# Patient Record
Sex: Female | Born: 1950 | Hispanic: Yes | Marital: Married | Smoking: Never smoker
Health system: Southern US, Community
[De-identification: ages and names within clinical notes are randomized; demographics above are authoritative.]

## PROBLEM LIST (undated history)

## (undated) DIAGNOSIS — R112 Nausea with vomiting, unspecified: Secondary | ICD-10-CM

## (undated) DIAGNOSIS — I10 Essential (primary) hypertension: Secondary | ICD-10-CM

## (undated) DIAGNOSIS — G5620 Lesion of ulnar nerve, unspecified upper limb: Secondary | ICD-10-CM

## (undated) DIAGNOSIS — Z9889 Other specified postprocedural states: Secondary | ICD-10-CM

---

## 2012-09-18 HISTORY — PX: OTHER SURGICAL HISTORY: SHX169

## 2015-01-26 ENCOUNTER — Encounter (HOSPITAL_BASED_OUTPATIENT_CLINIC_OR_DEPARTMENT_OTHER): Payer: Self-pay | Admitting: *Deleted

## 2015-01-26 NOTE — Progress Notes (Signed)
SPOKE W/ PT'S DAUGHTER, NAYELI RAMIREZ, WHOM WILL BE SPANISH INTERPRETOR DOS.  NPO AFTER MN WITH CLEAR LIQUIDS UNTIL 0700 (NO CREAM/ MILK PRODUCTS).  ARRIVE AT 1130. NEEDS ISTAT AND EKG.

## 2015-01-28 ENCOUNTER — Encounter (HOSPITAL_BASED_OUTPATIENT_CLINIC_OR_DEPARTMENT_OTHER): Payer: Self-pay | Admitting: *Deleted

## 2015-01-28 ENCOUNTER — Encounter (HOSPITAL_BASED_OUTPATIENT_CLINIC_OR_DEPARTMENT_OTHER)
Admission: RE | Disposition: A | Discharge status: Home / Self Care | Payer: Self-pay | Source: Ambulatory Visit | Attending: Orthopedic Surgery

## 2015-01-28 ENCOUNTER — Ambulatory Visit (HOSPITAL_BASED_OUTPATIENT_CLINIC_OR_DEPARTMENT_OTHER): Payer: 59 | Admitting: Anesthesiology

## 2015-01-28 ENCOUNTER — Ambulatory Visit (HOSPITAL_BASED_OUTPATIENT_CLINIC_OR_DEPARTMENT_OTHER)
Admission: RE | Admit: 2015-01-28 | Discharge: 2015-01-28 | Disposition: A | Discharge status: Home / Self Care | Payer: 59 | Source: Ambulatory Visit | Attending: Orthopedic Surgery | Admitting: Orthopedic Surgery

## 2015-01-28 DIAGNOSIS — M79601 Pain in right arm: Secondary | ICD-10-CM | POA: Diagnosis present

## 2015-01-28 DIAGNOSIS — Z9114 Patient's other noncompliance with medication regimen: Secondary | ICD-10-CM | POA: Insufficient documentation

## 2015-01-28 DIAGNOSIS — G5621 Lesion of ulnar nerve, right upper limb: Secondary | ICD-10-CM | POA: Insufficient documentation

## 2015-01-28 DIAGNOSIS — T8484XA Pain due to internal orthopedic prosthetic devices, implants and grafts, initial encounter: Secondary | ICD-10-CM | POA: Insufficient documentation

## 2015-01-28 DIAGNOSIS — I10 Essential (primary) hypertension: Secondary | ICD-10-CM | POA: Diagnosis not present

## 2015-01-28 HISTORY — DX: Essential (primary) hypertension: I10

## 2015-01-28 HISTORY — DX: Nausea with vomiting, unspecified: R11.2

## 2015-01-28 HISTORY — PX: ULNAR NERVE TRANSPOSITION: SHX2595

## 2015-01-28 HISTORY — PX: HARDWARE REMOVAL: SHX979

## 2015-01-28 HISTORY — DX: Lesion of ulnar nerve, unspecified upper limb: G56.20

## 2015-01-28 HISTORY — DX: Other specified postprocedural states: Z98.890

## 2015-01-28 LAB — POCT I-STAT 4, (NA,K, GLUC, HGB,HCT)
GLUCOSE: 104 mg/dL — AB (ref 65–99)
HEMATOCRIT: 42 % (ref 36.0–46.0)
Hemoglobin: 14.3 g/dL (ref 12.0–15.0)
POTASSIUM: 4 mmol/L (ref 3.5–5.1)
SODIUM: 141 mmol/L (ref 135–145)

## 2015-01-28 SURGERY — ULNAR NERVE DECOMPRESSION/TRANSPOSITION
Anesthesia: General | Site: Elbow | Laterality: Right

## 2015-01-28 MED ORDER — LACTATED RINGERS IV SOLN
INTRAVENOUS | Status: DC
  Administered 2015-01-28 (×2): via INTRAVENOUS
  Filled 2015-01-28: qty 1000

## 2015-01-28 MED ORDER — PROMETHAZINE HCL 25 MG/ML IJ SOLN
6.2500 mg | INTRAMUSCULAR | Status: DC | PRN
  Filled 2015-01-28: qty 1

## 2015-01-28 MED ORDER — MIDAZOLAM HCL 5 MG/5ML IJ SOLN
INTRAMUSCULAR | Status: DC | PRN
  Administered 2015-01-28: 2 mg via INTRAVENOUS

## 2015-01-28 MED ORDER — FENTANYL CITRATE (PF) 100 MCG/2ML IJ SOLN
INTRAMUSCULAR | Status: AC
  Filled 2015-01-28: qty 4

## 2015-01-28 MED ORDER — METOCLOPRAMIDE HCL 5 MG/ML IJ SOLN
INTRAMUSCULAR | Status: DC | PRN
  Administered 2015-01-28: 10 mg via INTRAVENOUS

## 2015-01-28 MED ORDER — FENTANYL CITRATE (PF) 100 MCG/2ML IJ SOLN
INTRAMUSCULAR | Status: DC | PRN
  Administered 2015-01-28 (×4): 50 ug via INTRAVENOUS

## 2015-01-28 MED ORDER — PROPOFOL 10 MG/ML IV BOLUS
INTRAVENOUS | Status: DC | PRN
  Administered 2015-01-28: 150 mg via INTRAVENOUS
  Administered 2015-01-28: 20 mg via INTRAVENOUS
  Administered 2015-01-28: 50 mg via INTRAVENOUS
  Administered 2015-01-28: 20 mg via INTRAVENOUS

## 2015-01-28 MED ORDER — ONDANSETRON HCL 4 MG/2ML IJ SOLN
INTRAMUSCULAR | Status: DC | PRN
  Administered 2015-01-28: 4 mg via INTRAVENOUS

## 2015-01-28 MED ORDER — CHLORHEXIDINE GLUCONATE 4 % EX LIQD
60.0000 mL | Freq: Once | CUTANEOUS | Status: DC
  Filled 2015-01-28: qty 60

## 2015-01-28 MED ORDER — MEPERIDINE HCL 25 MG/ML IJ SOLN
6.2500 mg | INTRAMUSCULAR | Status: DC | PRN
  Filled 2015-01-28: qty 1

## 2015-01-28 MED ORDER — OXYCODONE-ACETAMINOPHEN 5-325 MG PO TABS: 1.0000 | ORAL_TABLET | ORAL | Status: DC | PRN

## 2015-01-28 MED ORDER — ACETAMINOPHEN 10 MG/ML IV SOLN
INTRAVENOUS | Status: DC | PRN
Start: 2015-01-28 — End: 2015-01-28
  Administered 2015-01-28: 1000 mg via INTRAVENOUS

## 2015-01-28 MED ORDER — OXYCODONE HCL 5 MG PO TABS
5.0000 mg | ORAL_TABLET | Freq: Once | ORAL | Status: AC
Start: 2015-01-28 — End: 2015-01-28
  Administered 2015-01-28: 5 mg via ORAL
  Filled 2015-01-28: qty 1

## 2015-01-28 MED ORDER — BUPIVACAINE HCL (PF) 0.25 % IJ SOLN
INTRAMUSCULAR | Status: DC | PRN
  Administered 2015-01-28: 10 mL

## 2015-01-28 MED ORDER — CEFAZOLIN SODIUM-DEXTROSE 2-3 GM-% IV SOLR
2.0000 g | INTRAVENOUS | Status: AC
  Administered 2015-01-28: 2 g via INTRAVENOUS
  Filled 2015-01-28: qty 50

## 2015-01-28 MED ORDER — CEFAZOLIN SODIUM-DEXTROSE 2-3 GM-% IV SOLR
INTRAVENOUS | Status: AC
  Filled 2015-01-28: qty 50

## 2015-01-28 MED ORDER — FENTANYL CITRATE (PF) 100 MCG/2ML IJ SOLN
INTRAMUSCULAR | Status: AC
  Filled 2015-01-28: qty 2

## 2015-01-28 MED ORDER — LIDOCAINE HCL (CARDIAC) 20 MG/ML IV SOLN
INTRAVENOUS | Status: DC | PRN
  Administered 2015-01-28: 60 mg via INTRAVENOUS
  Administered 2015-01-28: 40 mg via INTRAVENOUS

## 2015-01-28 MED ORDER — DEXAMETHASONE SODIUM PHOSPHATE 4 MG/ML IJ SOLN
INTRAMUSCULAR | Status: DC | PRN
  Administered 2015-01-28: 10 mg via INTRAVENOUS

## 2015-01-28 MED ORDER — SUCCINYLCHOLINE CHLORIDE 20 MG/ML IJ SOLN
INTRAMUSCULAR | Status: DC | PRN
Start: 2015-01-28 — End: 2015-01-28
  Administered 2015-01-28: 100 mg via INTRAVENOUS

## 2015-01-28 MED ORDER — OXYCODONE HCL 5 MG PO TABS
ORAL_TABLET | ORAL | Status: AC
  Filled 2015-01-28: qty 1

## 2015-01-28 MED ORDER — DOCUSATE SODIUM 100 MG PO CAPS: 100.0000 mg | ORAL_CAPSULE | Freq: Two times a day (BID) | ORAL | Status: DC

## 2015-01-28 MED ORDER — MIDAZOLAM HCL 2 MG/2ML IJ SOLN
INTRAMUSCULAR | Status: AC
  Filled 2015-01-28: qty 2

## 2015-01-28 MED ORDER — FENTANYL CITRATE (PF) 100 MCG/2ML IJ SOLN
25.0000 ug | INTRAMUSCULAR | Status: DC | PRN
  Administered 2015-01-28: 25 ug via INTRAVENOUS
  Administered 2015-01-28: 50 ug via INTRAVENOUS
  Administered 2015-01-28: 25 ug via INTRAVENOUS
  Filled 2015-01-28: qty 1

## 2015-01-28 MED ORDER — LACTATED RINGERS IV SOLN
INTRAVENOUS | Status: DC
  Filled 2015-01-28: qty 1000

## 2015-01-28 MED ORDER — SODIUM CHLORIDE 0.9 % IR SOLN
Status: DC | PRN
  Administered 2015-01-28: 500 mL

## 2015-01-28 SURGICAL SUPPLY — 49 items
BANDAGE ELASTIC 4 VELCRO ST LF (GAUZE/BANDAGES/DRESSINGS) ×6 IMPLANT
BLADE SURG 15 STRL LF DISP TIS (BLADE) ×2 IMPLANT
BLADE SURG 15 STRL SS (BLADE) ×1
BNDG ESMARK 4X9 LF (GAUZE/BANDAGES/DRESSINGS) ×3 IMPLANT
BNDG GAUZE ELAST 4 BULKY (GAUZE/BANDAGES/DRESSINGS) ×6 IMPLANT
CORDS BIPOLAR (ELECTRODE) ×3 IMPLANT
COVER BACK TABLE 60X90IN (DRAPES) ×3 IMPLANT
COVER MAYO STAND STRL (DRAPES) ×3 IMPLANT
CUFF TOURNIQUET SINGLE 18IN (TOURNIQUET CUFF) ×3 IMPLANT
DRAIN PENROSE 18X1/4 LTX STRL (WOUND CARE) IMPLANT
DRAPE EXTREMITY T 121X128X90 (DRAPE) ×3 IMPLANT
DRAPE LG THREE QUARTER DISP (DRAPES) ×6 IMPLANT
DRAPE SURG 17X23 STRL (DRAPES) ×3 IMPLANT
DRAPE U-SHAPE 47X51 STRL (DRAPES) ×6 IMPLANT
DRSG ADAPTIC 3X8 NADH LF (GAUZE/BANDAGES/DRESSINGS) ×6 IMPLANT
ELECT REM PT RETURN 9FT ADLT (ELECTROSURGICAL) ×3
ELECTRODE REM PT RTRN 9FT ADLT (ELECTROSURGICAL) ×2 IMPLANT
GLOVE BIO SURGEON STRL SZ 6.5 (GLOVE) ×3 IMPLANT
GLOVE BIOGEL PI IND STRL 6.5 (GLOVE) ×2 IMPLANT
GLOVE BIOGEL PI IND STRL 8.5 (GLOVE) ×2 IMPLANT
GLOVE BIOGEL PI INDICATOR 6.5 (GLOVE) ×1
GLOVE BIOGEL PI INDICATOR 8.5 (GLOVE) ×1
GLOVE SURG ORTHO 8.0 STRL STRW (GLOVE) ×3 IMPLANT
GOWN STRL REUS W/ TWL LRG LVL3 (GOWN DISPOSABLE) ×4 IMPLANT
GOWN STRL REUS W/TWL LRG LVL3 (GOWN DISPOSABLE) ×2
NEEDLE HYPO 25X1 1.5 SAFETY (NEEDLE) ×3 IMPLANT
NS IRRIG 500ML POUR BTL (IV SOLUTION) ×3 IMPLANT
PACK BASIN DAY SURGERY FS (CUSTOM PROCEDURE TRAY) ×3 IMPLANT
PAD CAST 4YDX4 CTTN HI CHSV (CAST SUPPLIES) ×4 IMPLANT
PADDING CAST COTTON 4X4 STRL (CAST SUPPLIES) ×2
PENCIL BUTTON HOLSTER BLD 10FT (ELECTRODE) ×3 IMPLANT
SLING ARM XL FOAM STRAP (SOFTGOODS) ×3 IMPLANT
SPLINT FIBERGLASS 4X30 (CAST SUPPLIES) ×3 IMPLANT
SPONGE GAUZE 4X4 12PLY (GAUZE/BANDAGES/DRESSINGS) ×3 IMPLANT
SPONGE GAUZE 4X4 12PLY STER LF (GAUZE/BANDAGES/DRESSINGS) ×6 IMPLANT
SPONGE LAP 4X18 X RAY DECT (DISPOSABLE) ×3 IMPLANT
STOCKINETTE 4X48 STRL (DRAPES) ×3 IMPLANT
SUCTION FRAZIER TIP 10 FR DISP (SUCTIONS) ×3 IMPLANT
SUT PROLENE 3 0 PS 2 (SUTURE) ×9 IMPLANT
SUT VIC AB 0 CT1 36 (SUTURE) ×3 IMPLANT
SUT VIC AB 2-0 SH 27 (SUTURE) ×2
SUT VIC AB 2-0 SH 27XBRD (SUTURE) ×4 IMPLANT
SUT VICRYL 4-0 PS2 18IN ABS (SUTURE) ×6 IMPLANT
SYR BULB 3OZ (MISCELLANEOUS) ×3 IMPLANT
SYR CONTROL 10ML LL (SYRINGE) ×3 IMPLANT
TOWEL OR 17X24 6PK STRL BLUE (TOWEL DISPOSABLE) ×6 IMPLANT
TUBE CONNECTING 12X1/4 (SUCTIONS) ×3 IMPLANT
UNDERPAD 30X30 INCONTINENT (UNDERPADS AND DIAPERS) ×3 IMPLANT
YANKAUER SUCT BULB TIP NO VENT (SUCTIONS) ×3 IMPLANT

## 2015-01-28 NOTE — Anesthesia Preprocedure Evaluation (Addendum)
Anesthesia Evaluation  Patient identified by MRN, date of birth, ID band Patient awake    Reviewed: Allergy & Precautions, NPO status , Patient's Chart, lab work & pertinent test results  History of Anesthesia Complications (+) PONV  Airway Mallampati: II  TM Distance: >3 FB Neck ROM: Full    Dental no notable dental hx.    Pulmonary neg pulmonary ROS,  breath sounds clear to auscultation  Pulmonary exam normal       Cardiovascular hypertension, negative cardio ROS Normal cardiovascular examRhythm:Regular Rate:Normal     Neuro/Psych negative neurological ROS  negative psych ROS   GI/Hepatic negative GI ROS, Neg liver ROS,   Endo/Other  negative endocrine ROS  Renal/GU negative Renal ROS  negative genitourinary   Musculoskeletal negative musculoskeletal ROS (+)   Abdominal   Peds negative pediatric ROS (+)  Hematology negative hematology ROS (+)   Anesthesia Other Findings   Reproductive/Obstetrics negative OB ROS                            Anesthesia Physical Anesthesia Plan  ASA: II  Anesthesia Plan: General   Post-op Pain Management:    Induction: Intravenous  Airway Management Planned: Oral ETT  Additional Equipment:   Intra-op Plan:   Post-operative Plan: Extubation in OR  Informed Consent: I have reviewed the patients History and Physical, chart, labs and discussed the procedure including the risks, benefits and alternatives for the proposed anesthesia with the patient or authorized representative who has indicated his/her understanding and acceptance.   Dental advisory given  Plan Discussed with: CRNA  Anesthesia Plan Comments:         Anesthesia Quick Evaluation

## 2015-01-28 NOTE — Discharge Instructions (Signed)
KEEP BANDAGE CLEAN AND DRY CALL OFFICE FOR F/U APPT [phone] CALL FOR F/U APPT TO BE IN 14 DAYS KEEP HAND ELEVATED ABOVE HEART OK TO APPLY ICE TO OPERATIVE AREA CONTACT OFFICE IF ANY WORSENING PAIN OR CONCERNS.          HAND SURGERY    HOME CARE INSTRUCTIONS    The following instructions have been prepared to help you care for yourself upon your return home today.  Wound Care:  Keep your hand elevated above the level of your heart. Do not allow it to dangle by your side. Keep the dressing dry and do not remove it unless your doctor advises you to do so. He will usually change it at the time of you post-op visit. Moving your fingers is advised to stimulate circulation but will depend on the site of your surgery. Of course, if you have a splint applied your doctor will advise you about movement.  Activity:  Do not drive or operate machinery today. Rest today and then you may return to your normal activity and work as indicated by your physician.  Diet: Drink liquids today or eat a light diet. You may resume a regular diet tomorrow.  General expectations: Pain for two or three days. Fingers may become slightly swollen.   Unexpected Observations- Call your doctor if any of these occur: Severe pain not relieved by pain medication. Elevated temperature. Dressing soaked with blood. Inability to move fingers. White or bluish color to fingers.    Post Anesthesia Home Care Instructions  Activity: Get plenty of rest for the remainder of the day. A responsible adult should stay with you for 24 hours following the procedure.  For the next 24 hours, DO NOT: -Drive a car -Advertising copywriterperate machinery -Drink alcoholic beverages -Take any medication unless instructed by your physician -Make any legal decisions or sign important papers.  Meals: Start with liquid foods such as gelatin or soup. Progress to regular foods as tolerated. Avoid greasy, spicy, heavy foods. If nausea and/or vomiting  occur, drink only clear liquids until the nausea and/or vomiting subsides. Call your physician if vomiting continues.  Special Instructions/Symptoms: Your throat may feel dry or sore from the anesthesia or the breathing tube placed in your throat during surgery. If this causes discomfort, gargle with warm salt water. The discomfort should disappear within 24 hours.  If you had a scopolamine patch placed behind your ear for the management of post- operative nausea and/or vomiting:  1. The medication in the patch is effective for 72 hours, after which it should be removed.  Wrap patch in a tissue and discard in the trash. Wash hands thoroughly with soap and water. 2. You may remove the patch earlier than 72 hours if you experience unpleasant side effects which may include dry mouth, dizziness or visual disturbances. 3. Avoid touching the patch. Wash your hands with soap and water after contact with the patch.

## 2015-01-28 NOTE — H&P (Signed)
Mallory Cortez is an 64 y.o. female.   Chief Complaint: RIGHT ARM PAIN AND NUMBNESS HPI: PT UNDERWENT ORIF OF RIGHT HUMERAL SHAFT PT HAVING PAIN OVER MIDSHAFT OF HUMERUS AND ALSO NUMBNESS AND TINGLING IN HAND PT FOLLOWED IN OFFICE +EMG/NCV HERE FOR SURGERY  Past Medical History  Diagnosis Date  . Hypertension     PT REFUSES TO TAKE MEDS.  Marland Kitchen. Ulnar nerve compression     RIGHT ELBOW SECONDARY TO RETAINED HARDWARE  . PONV (postoperative nausea and vomiting)     Past Surgical History  Procedure Laterality Date  . Orif right ulna rx  2014    History reviewed. No pertinent family history. Social History:  reports that she has never smoked. She has never used smokeless tobacco. She reports that she does not drink alcohol or use illicit drugs.  Allergies:  Allergies  Allergen Reactions  . Penicillins Rash    No prescriptions prior to admission    No results found for this or any previous visit (from the past 48 hour(s)). No results found.  ROSNO RECENT ILLNESSES OR HOSPITALIZATIONS  Height 5\' 5"  (1.651 m), weight 88.451 kg (195 lb). Physical Exam  General Appearance:  Alert, cooperative, no distress, appears stated age  Head:  Normocephalic, without obvious abnormality, atraumatic  Eyes:  Pupils equal, conjunctiva/corneas clear,         Throat: Lips, mucosa, and tongue normal; teeth and gums normal  Neck: No visible masses     Lungs:   respirations unlabored  Chest Wall:  No tenderness or deformity  Heart:  Regular rate and rhythm,  Abdomen:   Soft, non-tender,         Extremities: RUE INCISION LOOKS GOOD FINGERS WARM WELL PERFUSED GOOD ELBOW AND WRIST MOBILITY ABLE TO CROSS FINGERS  Pulses: 2+ and symmetric  Skin: Skin color, texture, turgor normal, no rashes or lesions     Neurologic: Normal   Assessment/Plan RIGHT HUMERAL SHAFT FRACTURE WITH RETAINED SYMPTOMATIC HARDWARE RIGHT ELBOW ULNAR NERVE COMPRESSION  RIGHT HUMERAL SHAFT DEEP IMPLANT REMOVAL RIGHT  ELBOW ULNAR NERVE RELEASE  R/B/A DISCUSSED WITH PT IN OFFICE.  PT VOICED UNDERSTANDING OF PLAN CONSENT SIGNED DAY OF SURGERY PT SEEN AND EXAMINED PRIOR TO OPERATIVE PROCEDURE/DAY OF SURGERY SITE MARKED. QUESTIONS ANSWERED WILL GO HOME FOLLOWING SURGERY WE ARE PLANNING SURGERY FOR YOUR UPPER EXTREMITY. THE RISKS AND BENEFITS OF SURGERY INCLUDE BUT NOT LIMITED TO BLEEDING INFECTION, DAMAGE TO NEARBY NERVES ARTERIES TENDONS, FAILURE OF SURGERY TO ACCOMPLISH ITS INTENDED GOALS, PERSISTENT SYMPTOMS AND NEED FOR FURTHER SURGICAL INTERVENTION. WITH THIS IN MIND WE WILL PROCEED. I HAVE DISCUSSED WITH THE PATIENT THE PRE AND POSTOPERATIVE REGIMEN AND THE DOS AND DON'TS. PT VOICED UNDERSTANDING AND INFORMED CONSENT SIGNED.  Sharma Mallory Cortez W 01/28/2015, 7:59 AM

## 2015-01-28 NOTE — Transfer of Care (Signed)
Last Vitals:  Filed Vitals:   01/28/15 1200  BP: 157/64  Pulse: 69  Temp: 36.9 C  Resp: 18   Immediate Anesthesia Transfer of Care Note  Patient: Mallory Cortez  Procedure(s) Performed: Procedure(s) (LRB): RIGHT ELBOW ULNAR NERVE RELEASE   (Right) RIGHT ARM DEEP HARDWARE REMOVAL (Right)  Patient Location: PACU  Anesthesia Type: General  Level of Consciousness: awake, alert  and oriented  Airway & Oxygen Therapy: Patient Spontanous Breathing and Patient connected to face mask oxygen  Post-op Assessment: Report given to PACU RN and Post -op Vital signs reviewed and stable  Post vital signs: Reviewed and stable  Complications: No apparent anesthesia complications

## 2015-01-28 NOTE — Anesthesia Procedure Notes (Addendum)
Procedure Name: Intubation Date/Time: 01/28/2015 1:43 PM Performed by: Norva PavlovALLAWAY, Gino Garrabrant G Pre-anesthesia Checklist: Patient identified, Emergency Drugs available, Suction available and Patient being monitored Patient Re-evaluated:Patient Re-evaluated prior to inductionOxygen Delivery Method: Circle System Utilized Preoxygenation: Pre-oxygenation with 100% oxygen Intubation Type: IV induction, Cricoid Pressure applied and Rapid sequence Ventilation: Mask ventilation without difficulty Laryngoscope Size: Mac and 3 Grade View: Grade I Tube type: Oral Tube size: 7.0 mm Number of attempts: 1 Airway Equipment and Method: Stylet Placement Confirmation: ETT inserted through vocal cords under direct vision,  positive ETCO2 and breath sounds checked- equal and bilateral Secured at: 20 cm Tube secured with: Tape Dental Injury: Teeth and Oropharynx as per pre-operative assessment

## 2015-01-28 NOTE — Anesthesia Postprocedure Evaluation (Signed)
Anesthesia Post Note  Patient: Jones BroomMaria Rachal  Procedure(s) Performed: Procedure(s) (LRB): RIGHT ELBOW ULNAR NERVE RELEASE   (Right) RIGHT ARM DEEP HARDWARE REMOVAL (Right)  Anesthesia type: General  Patient location: PACU  Post pain: Pain level controlled and Adequate analgesia  Post assessment: Post-op Vital signs reviewed, Patient's Cardiovascular Status Stable, Respiratory Function Stable, Patent Airway and Pain level controlled  Last Vitals:  Filed Vitals:   01/28/15 1645  BP: 145/65  Pulse: 79  Temp:   Resp: 16    Post vital signs: Reviewed and stable  Level of consciousness: awake, alert  and oriented  Complications: No apparent anesthesia complications

## 2015-01-28 NOTE — Brief Op Note (Signed)
01/28/2015  1:34 PM  PATIENT:  Mallory BroomMaria Cortez  64 y.o. female  PRE-OPERATIVE DIAGNOSIS:  RIGHT ULNAR NERVE COMPRESSION, RETAINED HARDWARE   POST-OPERATIVE DIAGNOSIS:  RIGHT ULNAR NERVE COMPRESSION, RETAINED HARDWARE   PROCEDURE:  Procedure(s): RIGHT ELBOW ULNAR NERVE RELEASE AND OR TRANSPOSITION AS WELL AS A RIGHT ARM DEEP HARDWARE REMOVAL  (Right)  SURGEON:  Surgeon(s) and Role:    * Bradly BienenstockFred Malayasia Mirkin, MD - Primary  PHYSICIAN ASSISTANT:   ASSISTANTS: none   ANESTHESIA:   general  EBL:     BLOOD ADMINISTERED:none  DRAINS: none   LOCAL MEDICATIONS USED:  MARCAINE     SPECIMEN:  No Specimen  DISPOSITION OF SPECIMEN:  N/A  COUNTS:  YES  TOURNIQUET:    DICTATION: .Other Dictation: Dictation Number [id number]  PLAN OF CARE: Discharge to home after PACU  PATIENT DISPOSITION:  PACU - hemodynamically stable.   Delay start of Pharmacological VTE agent (>24hrs) due to surgical blood loss or risk of bleeding: not applicable

## 2015-01-29 ENCOUNTER — Encounter (HOSPITAL_BASED_OUTPATIENT_CLINIC_OR_DEPARTMENT_OTHER): Payer: Self-pay | Admitting: Orthopedic Surgery

## 2015-01-29 NOTE — Op Note (Signed)
NAMJones Broom:  Hegstrom, Shiquita                ACCOUNT NO.:  [id number]  MEDICAL RECORD NO.:  [id number]  LOCATION:                               [facility]  PHYSICIAN:  Sharma CovertFred W. Beadie Matsunaga IV, M.D.DATE OF BIRTH:  05-06-1951  DATE OF PROCEDURE:  01/28/2015 DATE OF DISCHARGE:  01/28/2015                              OPERATIVE REPORT   PREOPERATIVE DIAGNOSES: 1. Right humeral shaft retained hardware. 2. Right elbow ulnar nerve neuropathy.  POSTOPERATIVE DIAGNOSES: 1. Right humeral shaft retained hardware. 2. Right elbow ulnar nerve neuropathy.  ATTENDING PHYSICIAN:  Sharma CovertFred W. Lorain Keast, M.D., who scrubbed and present for the entire procedure.  ASSISTANT SURGEON:  None.  ANESTHESIA:  General via endotracheal tube.  SURGICAL PROCEDURE: 1. Right ulnar nerve decompression in situ. 2. Right humerus deep hardware removal. 3. Radiographs x2 views, right humerus.  SURGICAL INDICATIONS:  Ms. Felipa FurnaceGarcia is a right hand dominant female with persistent pain in the right arm.  The patient elected to undergo the above procedures.  Risks, benefits, and alternatives were discussed in detail with the patient and signed informed consent was obtained.  Risks include, but not limited to bleeding, infection, damage to nearby nerves, arteries, or tendons, loss of motion of the wrist and digits, incomplete relief of symptoms, and need for further surgical intervention.  DESCRIPTION OF PROCEDURE:  The patient was properly identified in the preoperative holding area and mark with a permanent marker made on the right arm.  The patient was brought back to the operating room, placed supine on the anesthesia room table where general anesthesia was administered.  The patient tolerated this well.  A well-padded tourniquet was placed.  The right upper extremity was then prepped and draped in normal sterile fashion.  Time-out was called, the correct site was identified, and the procedure then begun.  Sterile tourniquet  was then applied to the right brachium.  Attention was then turned to the ulnar nerve.  The limb was then elevated and tourniquet insufflated.  A curvilinear incision was made directly over the posteromedial aspect of the elbow.  Deep dissection carried down through the skin and subcutaneous tissue.  Distal branch of the medial antebrachial cutaneous nerve were then carefully protected and the ulnar nerve was then carefully identified just proximally at the entrance of the cubital canal.  Following this, the nerve was traced up proximally and released in its entirety greater than 15 cm proximal to the medial epicondyle. The nerve was then traced through the cubital tunnel through Osborne's ligament to the S2 fascia and released both proximally and distally very well.  Ulnar muscular septum was then released.  Following this, the nerve was then placed through a full range of motion, there did not appear to be any subluxation of the nerve, and therefore the nerve was not transposed.  The wound was then thoroughly irrigated.  After thorough wound irrigation, the deep subcutaneous tissue was closed with 2-0 Vicryl, subcutaneous tissue was closed with 4-0 Vicryl, and skin closed with a running 3-0 Prolene.  Marcaine 0.25% infiltrated locally. Through a separate incision, attention was then turned to removal of deep implant.  An anterolateral incision was then made to the  distal humerus.  Deep dissection carried down through the skin and subcutaneous tissue.  Blunt dissection carried all the way down to the plate.  Sharp instrumentation was then used for careful protection of the coursing of the radial nerve.  Blunt dissection carried down to the plate where the plate was then identified on the humeral shaft and using one instrumentation, this was elevated off the humeral shaft proximally and distally to expose the screws.  The screws were removed without any complicating features.  The plate  was then removed.  The patient tolerated this well.  Final radiographs were then obtained.  The wound was then thoroughly irrigated.  Deep subcutaneous tissues closed with 0- Vicryl, 2-0 Vicryl, and the skin closed with running 3-0 Prolene. Adaptic dressing and sterile compressive bandage then applied.  The patient was then placed in a well-padded long-arm splint, extubated, and taken to the recovery room in good condition.  POSTPROCEDURAL PLAN:  The patient discharged to home, seen back in the office in approximately 2 weeks for wound check, suture removal, and then begin in situ ulnar nerve release protocol.  INTRAOPERATIVE RADIOGRAPHS:  Three views of the humerus did show the removal internal fixation in place and there is good alignment in both planes.     Madelynn DoneFred W. Chamia Schmutz IV, M.D.     FWO/MEDQ  D:  01/28/2015  T:  01/29/2015  Job:  [id number]

## 2018-08-02 ENCOUNTER — Ambulatory Visit: Payer: Self-pay | Admitting: Family Medicine

## 2018-08-02 ENCOUNTER — Encounter: Payer: Self-pay | Admitting: Family Medicine

## 2018-08-02 VITALS — BP 126/70 | HR 58 | Temp 97.8°F | Ht 64.0 in | Wt 202.6 lb

## 2018-08-02 DIAGNOSIS — M179 Osteoarthritis of knee, unspecified: Secondary | ICD-10-CM | POA: Insufficient documentation

## 2018-08-02 DIAGNOSIS — I10 Essential (primary) hypertension: Secondary | ICD-10-CM | POA: Insufficient documentation

## 2018-08-02 DIAGNOSIS — F324 Major depressive disorder, single episode, in partial remission: Secondary | ICD-10-CM

## 2018-08-02 DIAGNOSIS — M25562 Pain in left knee: Secondary | ICD-10-CM

## 2018-08-02 DIAGNOSIS — M1710 Unilateral primary osteoarthritis, unspecified knee: Secondary | ICD-10-CM | POA: Insufficient documentation

## 2018-08-02 MED ORDER — MELOXICAM 15 MG PO TABS: 15.0000 mg | ORAL_TABLET | Freq: Every day | ORAL | 2 refills | Status: DC

## 2018-08-02 MED ORDER — PROPRANOLOL HCL 10 MG PO TABS
10.0000 mg | ORAL_TABLET | Freq: Three times a day (TID) | ORAL | 2 refills | Status: DC
Start: 2018-08-02 — End: 2020-02-06

## 2018-08-02 MED ORDER — AMITRIPTYLINE HCL 10 MG PO TABS: 10.0000 mg | ORAL_TABLET | Freq: Every day | ORAL | 2 refills | Status: DC

## 2018-08-02 NOTE — Assessment & Plan Note (Signed)
-  Stable with propranolol, will continue at current strength.

## 2018-08-02 NOTE — Assessment & Plan Note (Signed)
-  Concern for meniscal pathology -Referral placed to sports med -Rx for meloxicam and samples of Pennsaid given.

## 2018-08-02 NOTE — Assessment & Plan Note (Signed)
-  Recommend d/c of perphenazine and diazepam portion of current medication -Will continue amitriptyline portion of medication for now.

## 2018-08-02 NOTE — Progress Notes (Signed)
Mallory Cortez - 67 y.o. female MRN [id number]  Date of birth: Jul 07, 1951  Subjective Chief Complaint  Patient presents with  . Knee Pain   Due to language barrier, an interpreter was present via IPAD during the history-taking and subsequent discussion (and for part of the physical exam) with this patient.  HPI Mallory Cortez is a 67 y.o. female with history of HTN here today with complaint of L knee pain.  She reports having a fall about 3 months ago while walking across and unstable bridge.  Had some swelling initially which has improved however continues to have pain.  She does feel like the knee is unstable at times as well.  She was seen by a chiropractor in [place] who told her she had "water on the knee".   She did have negative xray as well but has not had any other imaging.  She has been using a combination pill of indomethacin/methocarbamol that she got in [place], this has helped some.  She also has been taking a combination of amitriptyline, diazepam and perphenazine to help with mood/depression.  She has found this helpful.  She denies side effects from medication.    ROS:  A comprehensive ROS was completed and negative except as noted per HPI  Allergies  Allergen Reactions  . Penicillins Rash    Past Medical History:  Diagnosis Date  . Hypertension    PT REFUSES TO TAKE MEDS.  Marland Kitchen. PONV (postoperative nausea and vomiting)   . Ulnar nerve compression    RIGHT ELBOW SECONDARY TO RETAINED HARDWARE    Past Surgical History:  Procedure Laterality Date  . HARDWARE REMOVAL Right 01/28/2015   Procedure: RIGHT ARM DEEP HARDWARE REMOVAL;  Surgeon: Bradly BienenstockFred Ortmann, MD;  [facility];  Service: Orthopedics;  Laterality: Right;  . ORIF RIGHT ULNA RX  2014  . ULNAR NERVE TRANSPOSITION Right 01/28/2015   Procedure: RIGHT ELBOW ULNAR NERVE RELEASE  ;  Surgeon: Bradly BienenstockFred Ortmann, MD;  [facility];  Service: Orthopedics;  Laterality: Right;    Social  History   Socioeconomic History  . Marital status: Married    Spouse name: Not on file  . Number of children: Not on file  . Years of education: Not on file  . Highest education level: Not on file  Occupational History  . Not on file  Social Needs  . Financial resource strain: Not on file  . Food insecurity:    Worry: Not on file    Inability: Not on file  . Transportation needs:    Medical: Not on file    Non-medical: Not on file  Tobacco Use  . Smoking status: Never Smoker  . Smokeless tobacco: Never Used  Substance and Sexual Activity  . Alcohol use: No  . Drug use: No  . Sexual activity: Not on file  Lifestyle  . Physical activity:    Days per week: Not on file    Minutes per session: Not on file  . Stress: Not on file  Relationships  . Social connections:    Talks on phone: Not on file    Gets together: Not on file    Attends religious service: Not on file    Active member of club or organization: Not on file    Attends meetings of clubs or organizations: Not on file    Relationship status: Not on file  Other Topics Concern  . Not on file  Social History Narrative  . Not on  file    History reviewed. No pertinent family history.  Health Maintenance  Topic Date Due  . Hepatitis C Screening  1950/10/09  . TETANUS/TDAP  06/01/1970  . MAMMOGRAM  06/01/2001  . COLONOSCOPY  06/01/2001  . DEXA SCAN  06/01/2016  . PNA vac Low Risk Adult (1 of 2 - PCV13) 06/01/2016  . INFLUENZA VACCINE  04/18/2018    ----------------------------------------------------------------------------------------------------------------------------------------------------------------------------------------------------------------- Physical Exam BP 126/70   Pulse (!) 58   Temp 97.8 F (36.6 C)   Ht 5\' 4"  (1.626 m)   Wt 202 lb 9.6 oz (91.9 kg)   SpO2 96%   BMI 34.78 kg/m   Physical Exam  Constitutional: She appears well-nourished. No distress.  HENT:  Head: Normocephalic and  atraumatic.  Mouth/Throat: Oropharynx is clear and moist.  Eyes: No scleral icterus.  Neck: Neck supple. No thyromegaly present.  Cardiovascular: Normal rate, regular rhythm and normal heart sounds.  Pulmonary/Chest: Effort normal and breath sounds normal.  Musculoskeletal:  L knee normal to inspection without effusion Mild pain to palpation along lateral joint line ROM is good Negative patellar compression.  Negative anterior/posterior drawer and lachman test MCL/LCL without laxity.  Moderate pain with testing lateral meniscus on McMurray Test.   Lymphadenopathy:    She has no cervical adenopathy.  Neurological: She is alert.  Skin: Skin is warm and dry.  Psychiatric: She has a normal mood and affect. Her behavior is normal.    ------------------------------------------------------------------------------------------------------------------------------------------------------------------------------------------------------------------- Assessment and Plan  Acute pain of left knee -Concern for meniscal pathology -Referral placed to sports med -Rx for meloxicam and samples of Pennsaid given.   Depression, major, single episode, in partial remission (HCC) -Recommend d/c of perphenazine and diazepam portion of current medication -Will continue amitriptyline portion of medication for now.   Essential hypertension -Stable with propranolol, will continue at current strength.

## 2018-08-02 NOTE — Patient Instructions (Signed)
Try pennsaid to knee.  Apply 1/2 pack twice per day   Dolor de rodilla en los adultos Knee Pain, Adult El dolor de rodilla es un problema frecuente en los adultos. Puede tener muchas causas, entre ellas:  Artritis.  Un saco lleno de lquido (quiste) o un crecimiento en la rodilla.  Una infeccin en la rodilla.  Una lesin que no se [place].  Dao, hinchazn o irritacin de los tejidos que sostienen la rodilla.  Por lo general, el dolor de rodilla no es un signo de un problema grave. El Estate manager/land agent solo con el tiempo y el descanso. De no ser as, un mdico podra indicarle que se haga estudios para hallar la causa del dolor. Estos pueden incluir lo siguiente:  Estudios de diagnstico por imgenes, como una radiografa, una resonancia magntica (RM) o una ecografa.  Aspiracin de una articulacin. En [employer], se extrae lquido de la rodilla.  Artroscopia. En este estudio, un tubo iluminado se introduce en la rodilla, y se proyecta una imagen en una pantalla.  Biopsia. En [employer], se toma [place] de tejido del organismo y se la estudia con un microscopio.  Siga estas instrucciones en su casa: Est atento a cualquier cambio en los sntomas. Tome estas medidas para Engineer, materials. Actividad  Ponga la rodilla en reposo.  No haga cosas que le causen dolor o que lo intensifiquen.  Evite las actividades o los ejercicios de alto impacto, como correr, Public relations account executive soga o hacer saltos de tijera. Instrucciones generales  [employer] de venta libre y los recetados solamente como se lo haya indicado el mdico.  Cuando est sentado o acostado, levante (eleve) la rodilla por encima del nivel del corazn.  Duerma con una almohada debajo de la rodilla.  Si se lo indicaron, colquese hielo sobre la rodilla: ? Ponga el hielo en una bolsa plstica. ? Coloque una [employer] piel y la bolsa de hielo. ? Coloque el hielo durante , 2 o 3veces por  da.  Pregntele al mdico si debe usar una Neurosurgeon.  Baje de peso si es necesario. El exceso de peso puede generar presin en la rodilla.  No consuma ningn producto que contenga nicotina o tabaco, como cigarrillos y Administrator, Civil Service. Fumar puede retrasar la curacin de cualquier problema que tenga en el hueso y la articulacin. Si necesita ayuda para dejar de fumar, consulte al [employer]. Comunquese con un mdico si:  El dolor de rodilla contina, [place].  Tiene fiebre junto con dolor de rodilla.  La rodilla se le tuerce o se le traba.  La rodilla se hincha, y la hinchazn empeora. Solicite ayuda de inmediato si:  La rodilla est caliente al tacto.  No puede mover la rodilla.  Siente un dolor intenso en la rodilla.  Siente dolor en el pecho.  Tiene dificultad para respirar. Resumen  El dolor de rodilla es un problema frecuente en los adultos. Puede ser consecuencia de muchas cosas, entre ellas, artritis, infecciones, quistes o lesiones.  Por lo general, el dolor de rodilla no es un signo de un problema grave; sin embargo, si no desaparece, un mdico podra indicarle que se realice algunos estudios para hallar la causa del dolor.  Est atento a cualquier cambio en los sntomas. [employer] dolor con descanso, medicamentos, actividad de poca intensidad y [place] de hielo.  Procure ayuda si el dolor contina o se intensifica demasiado, si la rodilla se tuerce o se traba, o  si tiene dolor en el pecho o dificultad para respirar. Esta informacin no tiene Theme park managercomo fin reemplazar el consejo del mdico. Asegrese de hacerle al mdico cualquier pregunta que tenga. Document Released: 02/21/2008 Document Revised: 01/11/2017 Document Reviewed: 04/20/2014 Elsevier Interactive Patient Education  2018 [employer]

## 2018-08-05 ENCOUNTER — Ambulatory Visit (INDEPENDENT_AMBULATORY_CARE_PROVIDER_SITE_OTHER): Payer: Self-pay

## 2018-08-05 ENCOUNTER — Ambulatory Visit: Payer: Self-pay | Admitting: Family Medicine

## 2018-08-05 ENCOUNTER — Encounter: Payer: Self-pay | Admitting: Family Medicine

## 2018-08-05 VITALS — BP 150/80 | HR 72 | Temp 97.5°F | Ht 64.0 in | Wt 205.4 lb

## 2018-08-05 DIAGNOSIS — M25562 Pain in left knee: Secondary | ICD-10-CM

## 2018-08-05 DIAGNOSIS — M1712 Unilateral primary osteoarthritis, left knee: Secondary | ICD-10-CM

## 2018-08-05 NOTE — Progress Notes (Signed)
Mallory BroomMaria Cortez - 67 y.o. female MRN [id number]  Date of birth: [birth year]  SUBJECTIVE:  Including CC & ROS.  Chief Complaint  Patient presents with  . Knee Pain    left knee pain , 3 mos    Mallory Cortez is a 67 y.o. female that is  Presenting with left knee pain.  The pain is occurring over the medial joint line.  Pain is intermittent.  She fell while she was in [place] about 3 weeks ago.  X-rays at that time were negative.  Having pain that is sharp and throbbing in nature.  It is moderate in severity.  Has not had any similar pain like this before.  Denies any previous surgeries.  Having mild swelling.  Pain is moderate to severe..  Interview conducted with spanish interpretor.    Review of Systems  Constitutional: Negative for fever.  HENT: Negative for congestion.   Respiratory: Negative for cough.   Cardiovascular: Negative for chest pain.  Gastrointestinal: Negative for abdominal pain.  Musculoskeletal: Positive for joint swelling.  Skin: Negative for color change.  Neurological: Negative for weakness.  Hematological: Negative for adenopathy.  Psychiatric/Behavioral: Negative for agitation.    HISTORY: Past Medical, Surgical, Social, and Family History Reviewed & Updated per EMR.   Pertinent Historical Findings include:  Past Medical History:  Diagnosis Date  . Hypertension    PT REFUSES TO TAKE MEDS.  Marland Kitchen. PONV (postoperative nausea and vomiting)   . Ulnar nerve compression    RIGHT ELBOW SECONDARY TO RETAINED HARDWARE    Past Surgical History:  Procedure Laterality Date  . HARDWARE REMOVAL Right 01/28/2015   Procedure: RIGHT ARM DEEP HARDWARE REMOVAL;  Surgeon: Bradly BienenstockFred Ortmann, MD;  [facility];  Service: Orthopedics;  Laterality: Right;  . ORIF RIGHT ULNA RX  2014  . ULNAR NERVE TRANSPOSITION Right 01/28/2015   Procedure: RIGHT ELBOW ULNAR NERVE RELEASE  ;  Surgeon: Bradly BienenstockFred Ortmann, MD;  [facility];  Service: Orthopedics;   Laterality: Right;    Allergies  Allergen Reactions  . Penicillins Rash    No family history on file.   Social History   Socioeconomic History  . Marital status: Married    Spouse name: Not on file  . Number of children: Not on file  . Years of education: Not on file  . Highest education level: Not on file  Occupational History  . Not on file  Social Needs  . Financial resource strain: Not on file  . Food insecurity:    Worry: Not on file    Inability: Not on file  . Transportation needs:    Medical: Not on file    Non-medical: Not on file  Tobacco Use  . Smoking status: Never Smoker  . Smokeless tobacco: Never Used  Substance and Sexual Activity  . Alcohol use: No  . Drug use: No  . Sexual activity: Not on file  Lifestyle  . Physical activity:    Days per week: Not on file    Minutes per session: Not on file  . Stress: Not on file  Relationships  . Social connections:    Talks on phone: Not on file    Gets together: Not on file    Attends religious service: Not on file    Active member of club or organization: Not on file    Attends meetings of clubs or organizations: Not on file    Relationship status: Not on file  . Intimate partner  violence:    Fear of current or ex partner: Not on file    Emotionally abused: Not on file    Physically abused: Not on file    Forced sexual activity: Not on file  Other Topics Concern  . Not on file  Social History Narrative  . Not on file     PHYSICAL EXAM:  VS: BP (!) 150/80 (BP Location: Left Arm, Patient Position: Sitting, Cuff Size: Normal)   Pulse 72   Temp (!) 97.5 F (36.4 C) (Oral)   Ht 5\' 4"  (1.626 m)   Wt 205 lb 6.4 oz (93.2 kg)   SpO2 96%   BMI 35.26 kg/m  Physical Exam Gen: NAD, alert, cooperative with exam, well-appearing ENT: normal lips, normal nasal mucosa,  Eye: normal EOM, normal conjunctiva and lids CV:  no edema, +2 pedal pulses   Resp: no accessory muscle use, non-labored,   Skin: no  rashes, no areas of induration  Neuro: normal tone, normal sensation to touch Psych:  normal insight, alert and oriented MSK:  Left Knee: Normal to inspection with no erythema  Mild effusion  Palpation normal with no warmth,  TTP of the medial joint line ROM full in flexion and extension and lower leg rotation. Ligaments with solid consistent endpoints including LCL, MCL. Too much pain to perform Thessalonian tests. Non painful patellar compression. Patellar glide without crepitus. Patellar and quadriceps tendons unremarkable. Hamstring and quadriceps strength is normal.    Aspiration/Injection Procedure Note Mallory Cortez 04-14-1951  Procedure: Injection Indications: left knee pain   Procedure Details Consent: Risks of procedure as well as the alternatives and risks of each were explained to the (patient/caregiver).  Consent for procedure obtained. Time Out: Verified patient identification, verified procedure, site/side was marked, verified correct patient position, special equipment/implants available, medications/allergies/relevent history reviewed, required imaging and test results available.  Performed.  The area was cleaned with iodine and alcohol swabs.    The left knee superior lateral suprapatellar pouch was injected using 1 cc's of 40 mg Depomedrol and 4 cc's of 1% lidocaine with a 25 1 1/2" needle.  Ultrasound was used. Images were obtained in  Long views showing the injection.    A sterile dressing was applied.  Patient did tolerate procedure well.      ASSESSMENT & PLAN:   OA (osteoarthritis) of knee Pain likely related to degenerative changes with OA as seen on [place] and degenerative meniscus.  - injection today  - counseled on HEP and supportive care - xray  - if no improvement consider PT or bracing.

## 2018-08-05 NOTE — Patient Instructions (Signed)
Nice to meet you  Take tylenol 650 mg three times a day is the best evidence based medicine we have for arthritis.  Glucosamine sulfate 750mg  twice a day is a supplement that has been shown to help moderate to severe arthritis. Vitamin D 2000 IU daily Fish oil 2 grams daily.  Tumeric 500mg  twice daily.  Capsaicin topically up to four times a day may also help with pain. Please see me back in 4-6 weeks if your pain isn't improving.

## 2018-08-07 ENCOUNTER — Telehealth: Payer: Self-pay | Admitting: Family Medicine

## 2018-08-07 NOTE — Assessment & Plan Note (Signed)
Pain likely related to degenerative changes with OA as seen on US and degenerative meniscus.  - injection today  - counseled on HEP and supportive care - xray  - if no improvement consider PT or bracing.

## 2018-08-07 NOTE — Telephone Encounter (Signed)
Unable to leave VM for patient. If she calls back please have her speak with a nurse/CMA and inform that she has mild degenerative changes of the inside of her knee. The [facility] can report results to patient.   If any questions then please take the best time and phone number to call and I will try to call her back.   Myra RudeSchmitz, Jeremy E, MD [facility] 08/07/2018, 9:28 AM

## 2019-11-28 ENCOUNTER — Ambulatory Visit: Payer: Self-pay | Attending: Internal Medicine

## 2019-11-28 DIAGNOSIS — Z23 Encounter for immunization: Secondary | ICD-10-CM

## 2019-11-28 NOTE — Progress Notes (Signed)
   Covid-19 Vaccination Clinic  Name:  Mallory Cortez    MRN: [id number] DOB: 15-Mar-1951  11/28/2019  [name] was observed post Covid-19 immunization for 15 minutes without incident. She was provided with Vaccine Information Sheet and instruction to access the V-Safe system.   [name] was instructed to call 911 with any severe reactions post vaccine: Marland Kitchen Difficulty breathing  . Swelling of face and throat  . A fast heartbeat  . A bad rash all over body  . Dizziness and weakness   Immunizations Administered    Name Date Dose VIS Date Route   Pfizer COVID-19 Vaccine 11/28/2019 12:23 PM 0.3 mL 08/29/2019 Intramuscular   Manufacturer: [employer], [employer]   Lot: [id number]   NDC: [address]-2

## 2019-12-23 ENCOUNTER — Ambulatory Visit: Payer: Self-pay | Attending: Internal Medicine

## 2019-12-23 DIAGNOSIS — Z23 Encounter for immunization: Secondary | ICD-10-CM

## 2019-12-23 NOTE — Progress Notes (Signed)
   Covid-19 Vaccination Clinic  Name:  Mallory Cortez    MRN: [id number] DOB: 18-Sep-1951  12/23/2019  [name] was observed post Covid-19 immunization for 15 minutes without incident. She was provided with Vaccine Information Sheet and instruction to access the V-Safe system.   [name] was instructed to call 911 with any severe reactions post vaccine: Marland Kitchen Difficulty breathing  . Swelling of face and throat  . A fast heartbeat  . A bad rash all over body  . Dizziness and weakness   Immunizations Administered    Name Date Dose VIS Date Route   Pfizer COVID-19 Vaccine 12/23/2019 12:01 PM 0.3 mL 08/29/2019 Intramuscular   Manufacturer: [employer], [employer]   Lot: [id number]   NDC: [address]-8

## 2020-02-06 ENCOUNTER — Encounter: Payer: Self-pay | Admitting: Family Medicine

## 2020-02-06 ENCOUNTER — Ambulatory Visit: Payer: Self-pay | Admitting: Family Medicine

## 2020-02-06 ENCOUNTER — Other Ambulatory Visit: Payer: Self-pay

## 2020-02-06 VITALS — BP 120/72 | HR 74 | Temp 97.9°F | Ht 64.0 in | Wt 197.4 lb

## 2020-02-06 DIAGNOSIS — F419 Anxiety disorder, unspecified: Secondary | ICD-10-CM

## 2020-02-06 MED ORDER — PAROXETINE HCL 10 MG PO TABS: 10.0000 mg | ORAL_TABLET | Freq: Every day | ORAL | 1 refills | Status: DC

## 2020-02-06 NOTE — Progress Notes (Signed)
Subjective:  Patient ID: Mallory Cortez, female    DOB: June 14, 1951  Age: 69 y.o. MRN: [id number]  CC: Insomnia (c/o insomnia x 3 months not getting better. Also feels like BP is elevated sometimes. )   HPI Nethra Mehlberg presents for for follow-up.  She is accompanied by her daughter who is helping translate.  Has not needed blood pressure medicine or the propranolol in some time now.  Not feeling particularly depressed these days.  Does have trouble some trouble falling asleep.  Has been taking melatonin but it only helps a bit.    Outpatient Medications Prior to Visit  Medication Sig Dispense Refill  . Glucosamine-Chondroit-Vit C-Mn (GLUCOSAMINE 1500 COMPLEX PO) Take by mouth.    Marland Kitchen amitriptyline (ELAVIL) 10 MG tablet Take 1 tablet (10 mg total) by mouth at bedtime. (Patient not taking: Reported on 02/06/2020) 30 tablet 2  . meloxicam (MOBIC) 15 MG tablet Take 1 tablet (15 mg total) by mouth daily. (Patient not taking: Reported on 02/06/2020) 30 tablet 2  . propranolol (INDERAL) 10 MG tablet Take 1 tablet (10 mg total) by mouth 3 (three) times daily. (Patient not taking: Reported on 02/06/2020) 90 tablet 2   No facility-administered medications prior to visit.    ROS Review of Systems  Constitutional: Negative.   HENT: Negative.   Respiratory: Negative.   Cardiovascular: Negative.   Gastrointestinal: Negative.   Genitourinary: Negative.   Neurological: Negative for headaches.  Psychiatric/Behavioral: Positive for sleep disturbance. Negative for dysphoric mood and self-injury.   Depression screen [facility] 2/9 02/06/2020 08/02/2018  Decreased Interest 0 0  Down, Depressed, Hopeless 0 0  PHQ - 2 Score 0 0  Altered sleeping 2 -  Tired, decreased energy 1 -  Change in appetite 0 -  Feeling bad or failure about yourself  0 -  Trouble concentrating 0 -  Moving slowly or fidgety/restless 0 -  Suicidal thoughts 0 -  PHQ-9 Score 3 -  Difficult doing work/chores Not difficult at all -     Objective:  BP 120/72   Pulse 74   Temp 97.9 F (36.6 C) (Tympanic)   Ht 5\' 4"  (1.626 m)   Wt 197 lb 6.4 oz (89.5 kg)   SpO2 96%   BMI 33.88 kg/m   BP Readings from Last 3 Encounters:  02/06/20 120/72  08/05/18 (!) 150/80  08/02/18 126/70    Wt Readings from Last 3 Encounters:  02/06/20 197 lb 6.4 oz (89.5 kg)  08/05/18 205 lb 6.4 oz (93.2 kg)  08/02/18 202 lb 9.6 oz (91.9 kg)    Physical Exam Vitals and nursing note reviewed.  Constitutional:      Appearance: Normal appearance.  HENT:     Right Ear: External ear normal.     Left Ear: External ear normal.  Eyes:     Extraocular Movements: Extraocular movements intact.     Conjunctiva/sclera: Conjunctivae normal.  Pulmonary:     Effort: Pulmonary effort is normal.  Neurological:     Mental Status: She is alert and oriented to person, place, and time.  Psychiatric:        Mood and Affect: Mood normal.        Behavior: Behavior normal.     Lab Results  Component Value Date   HGB 14.3 01/28/2015   HCT 42.0 01/28/2015   GLUCOSE 104 (H) 01/28/2015   NA 141 01/28/2015   K 4.0 01/28/2015    No results found.  Assessment & Plan:   Problem List  Items Addressed This Visit    None    Visit Diagnoses    Anxiety    -  Primary   Relevant Medications   PARoxetine (PAXIL) 10 MG tablet     Meds ordered this encounter  Medications  . PARoxetine (PAXIL) 10 MG tablet    Sig: Take 1 tablet (10 mg total) by mouth daily.    Dispense:  30 tablet    Refill:  1      Follow-up: Return in about 1 month (around 03/08/2020), or daughter will call and let me know if working. Will continue over the phone..   Given information in Spanish about dealing with anxiety insomnia. Will adjust the low-dose Paxil to the of day that best works for her. Advised that it may take a few weeks. If working for her will fill over the phone. Otherwise will need to return in a month or so.  Libby Maw, MD

## 2020-02-06 NOTE — Patient Instructions (Signed)
Control de la [employer] adultos Managing Anxiety, Adult Despus de haber sido diagnosticado con trastorno de ansiedad, podra sentirse aliviado por comprender por qu se haba sentido o haba actuado de cierto modo. Es posible que tambin se sienta abrumado por el tratamiento que tiene por delante y por lo que este significar para su vida. Con atencin y [place], [place] afeccin y recuperarse. Cmo manejar los cambios en el estilo de vida Control del estrs y la ansiedad  El estrs es la reaccin del cuerpo ante los cambios y los acontecimientos de la vida, tanto [place]. La [employer]ley[employer]son de los episodios de estrs duran slo algunas horas, pero el estrs puede ser continuo y Science writer a ms que solo estrs. Aunque el estrs puede desempear un papel importante en la ansiedad, no es lo mismo que la ansiedad. El estrs generalmente es causado por algo externo, como una fecha lmite, una prueba o una competencia. El estrs normalmente pasa despus de que el evento desencadenante ha terminado.  La ansiedad es causada por algo interno, por ejemplo, imaginar un resultado terrible o preocuparse porque algo ir mal y lo devastar. A menudo, la ansiedad no desaparece incluso despus de que el evento desencadenante ha finalizado y puede tornarse en una preocupacin a largo plazo (crnica). Es importante comprender las diferencias entre el estrs y la ansiedad, y Chief Operating Officer el estrs de manera efectiva para que no genere una respuesta de ansiedad. Hable con el mdico o un consejero para obtener ms informacin sobre cmo reducir la ansiedad y Development worker, community. Es posible que el profesional sugiera tcnicas para reducir la tensin, tales como:  Musicoterapia. Esto podra incluir crear o escuchar msica que disfrute y lo inspire.  Meditacin consciente. Esto implica prestar atencin a la respiracin normal sin intentar controlarla. Puede realizarse mientras est sentado o camina.  Oracin centrante.  Esto implica centrarse en una palabra, frase o imagen sagrada que le signifique algo y le genere paz.  Respiracin profunda. Para hacer esto, expanda el estmago e inhale lentamente por la nariz. Mantenga el aire durante unos 3a5segundos. Luego, exhale lentamente mientras deja que los msculos del estmago se relajen.  Dilogo interno. Esto implica identificar patrones de pensamiento que provocan reacciones de ansiedad y cambiar esos patrones.  Relajacin muscular. Esto implica tensar los msculos y, [place], [place]. Elija una tcnica para reducir la tensin que se adapte a su estilo de vida y su personalidad. Estas tcnicas llevan tiempo y prctica. Resrvese de 5a23minutos por da para Associate Professor. Algunos terapeutas pueden ofrecer orientacin y capacitacin en estas tcnicas. Es posible que algunos planes de seguros mdicos cubran la capacitacin. Otras cosas que puede hacer para controlar el estrs y la ansiedad incluyen:  Llevar un diario de estrs/ansiedad. Esto puede ayudarlo a identificar qu le desencadena su reaccin y, Engineer, mining, aprender las maneras de controlar su respuesta al [place].  Pensar en cmo reacciona ante ciertas situaciones. Es posible que no sea capaz de Chief Operating Officer todo, pero puede Corporate investment banker.  Hacerse tiempo para las actividades que lo ayudan a Lexicographer y no sentir culpa por pasar su tiempo de [place].  La formacin de imgenes visuales y el yoga pueden ayudarlo a Pharmacologist la calma y [place].  Medicamentos Los medicamentos pueden ayudar a [employer]. Algunos medicamentos para la ansiedad:  Programme researcher, broadcasting/film/video ansiedad.  Antidepresivos. A menudo, los medicamentos se usan como tratamiento primario para el trastorno de [place]. Un mdico recetar los medicamentos. Cuando se usan juntos, los [place],  la psicoterapia y las tcnicas de reduccin de la tensin pueden ser el tratamiento ms efectivo. [place]  interpersonales pueden ser muy importantes para ayudar a su recuperacin. Intente pasar ms tiempo interactuando con amigos y familiares de [place]. Considere la posibilidad de ir a terapia de pareja, tomar clases de educacin familiar o ir a Careers information officer. La terapia puede ayudarlos a usted y a los dems a comprender mejor su afeccin. Cmo Museum/gallery conservator en su ansiedad Cada persona responde de [place] diferente al tratamiento de la ansiedad. Se dice que est recuperado de la ansiedad cuando los sntomas disminuyen y dejan de Cabin crew en las actividades diarias en el hogar o [place]. Esto podra significar que usted comenzar a Field seismologist lo siguiente:  Associate Professor y atencin. Tener menos interferencia de la preocupacin en el pensamiento diario.  Dormir mejor.  Estar menos irritable.  Tener ms energa.  Tener [employer]. Es Glass blower/designer cundo el trastorno [place]. Comunquese con el mdico si sus sntomas interfieren en su hogar o su trabajo, y usted siente que su afeccin no est mejorando. Siga estas instrucciones en su casa: Actividad  Realizar actividad fsica. La [employer] de los adultos debe hacer lo siguiente: ? Optometrist, al [employer], 190minutos de actividad fsica por semana. El ejercicio debe aumentar la frecuencia cardaca y Nature conservation officer transpirar (ejercicio de intensidad moderada). ? Realizar ejercicios de fortalecimiento por lo [employer] por semana.  Dormir bien y por el tiempo adecuado. La [employer] de los adultos necesitan entre 7y9horas de sueo todas las noches. Estilo de vida   Siga una dieta saludable que incluya abundantes frutas, verduras, cereales integrales, productos lcteos descremados y protenas magras. No consuma muchos alimentos ricos en grasas slidas, azcares agregados o sal.  Opte por cosas que le simplifiquen la vida.  No consuma ningn producto que contenga nicotina o tabaco, como cigarrillos, cigarrillos electrnicos y  tabaco de Higher education careers adviser. Si necesita ayuda para dejar de fumar, consulte al mdico.  Evite el consumo de cafena, alcohol y ciertos medicamentos contra el resfro de venta sin receta. Estos podran Engineer, building services. Pregntele al farmacutico qu medicamentos no debera tomar. Instrucciones generales  [employer] de venta libre y los recetados solamente como se lo haya indicado el mdico.  Consulting civil engineer a todas las visitas de seguimiento como se lo haya indicado el mdico. Esto es importante. Dnde buscar apoyo Puede conseguir ayuda y [employer] siguientes lugares:  Grupos de [place].  Organizaciones comunitarias y en lnea.  Un lder espiritual de confianza.  Terapia de pareja.  Clases de educacin familiar.  Terapia familiar. Dnde buscar ms informacin Formar parte de un grupo de apoyo podra resultarle til para enfrentar la ansiedad. Las siguientes fuentes pueden ayudarlo a Orthoptist consejeros o grupos de apoyo cerca de su hogar:  [place] (Salud Mental de los [place]): www.mentalhealthamerica.net  Anxiety and Depression Association of [place] [ADAA] (Asociacin de Ansiedad y Depresin de los Estados Unidos): https://www.clark.net/  National Alliance on Mental Illness [NAMI] ([place]): www.nami.org Comunquese con un mdico si:  Le resulta difcil permanecer concentrado o finalizar las tareas diarias.  Pasa muchas horas por da sintindose preocupado por la vida cotidiana.  La preocupacin le provoca un cansancio extremo.  Comienza a tener dolores de [place] o nuseas, o a sentirse tenso.  Orina ms de lo normal.  Tiene diarrea. Solicite ayuda inmediatamente si tiene:  Latidos cardacos acelerados y falta de aire.  Pensamientos acerca de lastimarse o  lastimar a Economist. Si alguna vez siente que puede lastimarse o Physicist, medical a Economist, o tiene pensamientos de poner fin a su vida, busque ayuda de  inmediato. Puede dirigirse al servicio de emergencias ms cercano o comunicarse con:  El servicio de emergencias de su localidad (911 en [place].).  Una lnea de asistencia al suicida y Visual merchandiser en crisis, como National Suicide Prevention Lifeline (Lnea Nacional de Prevencin del Suicidio), al (340)571-5694. Est disponible las 24 horas del da. Resumen  Tomar medidas para aprender y usar tcnicas de reduccin de la tensin puede ayudarlo a calmarse y a Chiropractor reaccin de ansiedad.  Cuando se usan juntos, los medicamentos, la psicoterapia y las tcnicas de reduccin de la tensin pueden ser el tratamiento ms efectivo.  Los familiares, los amigos y las parejas pueden tener un lugar importante en su recuperacin del trastorno de ansiedad. Esta informacin no tiene Theme park manager el consejo del mdico. Asegrese de hacerle al mdico cualquier pregunta que tenga. Document Revised: 03/12/2019 Document Reviewed: 03/12/2019 Elsevier Patient Education  2020 Elsevier Inc.  Insomnio Insomnia El insomnio es un trastorno del sueo que causa dificultades para conciliar el sueo o para [place]. Puede producir fatiga, falta de energa, dificultad para concentrarse, cambios en el estado de nimo y mal rendimiento escolar o laboral. Hay tres formas diferentes de clasificar el insomnio:  Dificultad para conciliar el sueo.  Dificultad para mantener el sueo.  Despertar muy precoz por la maana. Cualquier tipo de insomnio puede ser a Air cabin crew (crnico) o a Product manager (agudo). Ambos son frecuentes. Generalmente, el insomnio a corto plazo dura tres meses o menos tiempo. El crnico ocurre al menos tres veces por semana durante ms de tres meses. Cules son las causas? El insomnio puede deberse a otra afeccin, situacin o sustancia, por ejemplo:  Ansiedad.  Ciertos medicamentos.  Enfermedad de reflujo gastroesofgico (ERGE) u otras enfermedades gastrointestinales.  Asma  y otras enfermedades respiratorias.  Sndrome de las piernas inquietas, apnea del sueo u otros trastornos del sueo.  Dolor crnico.  Menopausia.  Accidente cerebrovascular.  Consumo excesivo de alcohol, tabaco u drogas ilegales.  Afecciones de salud mental, como depresin.  Cafena.  Trastornos neurolgicos, como enfermedad de Alzheimer.  Hiperactividad de la glndula tiroidea (hipertiroidismo). En ocasiones, la causa del insomnio puede ser desconocida. Qu incrementa el riesgo? Los factores de riesgo de tener insomnio incluyen lo siguiente:  Sexo. La enfermedad afecta ms a menudo a las mujeres que a los hombres.  Edad. El insomnio es ms frecuente a medida que una persona envejece.  Estrs.  La falta de actividad fsica.  Los horarios de trabajo irregulares o los turnos nocturnos.  Los viajes a lugares de diferentes zonas horarias.  Ciertas afecciones mdicas y de salud mental. Cules son los signos o los sntomas? Si tiene insomnio, el sntoma principal es la dificultad para conciliar el sueo o mantenerlo. Esto puede derivar en otros sntomas, por ejemplo:  Sentirse fatigado o tener poca energa.  Ponerse nervioso por [employer] irse a dormir.  No sentirse descansado por la maana.  Tener dificultad para concentrarse.  Sentirse irritable, ansioso o deprimido. Cmo se diagnostica? Esta afeccin se puede diagnosticar en funcin de lo siguiente:  Los sntomas y antecedentes mdicos. El mdico puede hacerle preguntas sobre: ? Hbitos de sueo. ? Cualquier afeccin mdica que tenga. ? La salud mental.  Un examen fsico. Cmo se trata? El tratamiento para el insomnio depende de la causa. El tratamiento puede centrarse en tratar  una afeccin preexistente que causa el insomnio. El tratamiento tambin puede incluir lo siguiente:  Medicamentos que lo ayuden a dormir.  Asesoramiento psicolgico o terapia.  Ajustes en el estilo de vida para ayudarlo a  dormir mejor. Siga estas indicaciones en su casa: Comida y bebida   Limite o evite el consumo de alcohol, bebidas con cafena y cigarrillos, especialmente cerca de la hora de [place], ya que pueden perturbarle el sueo.  No consuma una comida suculenta ni coma alimentos condimentados justo antes de la hora de [place]. Esto puede causarle molestias digestivas y dificultades para dormir. Hbitos de sueo   Lleve un registro del sueo ya que podra ser de utilidad para que usted y a su mdico puedan determinar qu podra estar causndole insomnio. Escriba los siguientes datos: ? Cundo duerme. ? Cundo se despierta durante la noche. ? Qu tan bien duerme. ? Qu tan relajado se siente al da siguiente. ? Cualquier efecto secundario de los [employer] toma. ? Lo que usted come y bebe.  Convierta su habitacin en un lugar oscuro, cmodo donde sea fcil conciliar el sueo. ? Coloque persianas o cortinas oscuras que impidan la entrada de la luz del exterior. ? Para bloquear los ruidos, use un aparato que reproduzca sonidos ambientales o relajantes de fondo. ? Mantenga baja la temperatura.  Limite el uso de pantallas antes de la hora de [place]. Esto incluye lo siguiente: ? Mirar televisin. ? Usar el telfono inteligente, la tableta o la computadora.  Siga una rutina que incluya ir a dormir y Chiropodist a la misma hora cada da y noche. Esto puede ayudarlo a conciliar el sueo ms rpidamente. Considere realizar [employer] tranquila, como leer, e incorporarla como parte de la rutina a la hora de irse a dormir.  Trate de evitar tomar siestas durante el da para que pueda dormir mejor por la noche.  Levntese de la cama si sigue despierto despus de de haber intentado dormirse. Mantenga bajas las luces, pero intente leer o hacer una actividad tranquila. Cuando tenga sueo, regrese a Pharmacist, hospital. Instrucciones generales  [employer] de venta libre y los  recetados solamente como se lo haya indicado el mdico.  Realice ejercicio con regularidad como se lo haya indicado el mdico. Evite la actividad fsica desde varias horas antes de irse a dormir.  Utilice tcnicas de relajacin para controlar el estrs. [place] al mdico que le sugiera algunas tcnicas que sean adecuadas para usted. Estos pueden incluir lo siguiente: ? Ejercicios de respiracin. ? Rutinas para aliviar la tensin muscular. ? Visualizacin de escenas apacibles.  Conduzca con cuidado. No conduzca si est muy somnoliento.  Concurra a todas las visitas de control como se lo haya indicado el mdico. Esto es importante. Comunquese con un mdico si:  Est cansado durante todo Medical laboratory scientific officer.  Tiene dificultad en su rutina diaria debido a la somnolencia.  Sigue teniendo problemas para dormir o Teacher, English as a foreign language. Solicite ayuda de inmediato si:  Piensa seriamente en lastimarse a usted mismo o a Engineer, maintenance (IT). Si alguna vez siente que puede lastimarse a usted mismo o a Economist, o tiene pensamientos de poner fin a su vida, busque ayuda de inmediato. Puede dirigirse al servicio de emergencias ms cercano o comunicarse con:  El servicio de emergencias de su localidad (911 en [place]  Una lnea de asistencia al suicida y Visual merchandiser en crisis, como la [employer] de Prevencin del Suicidio (National Suicide Prevention Lifeline), al (708)295-4233. Est disponible las 24 horas del  da. Resumen  El insomnio es un trastorno del sueo que causa dificultades para conciliar el sueo o para [place].  El insomnio puede ser a Air cabin crew (crnico) o a Product manager (agudo).  El tratamiento para el insomnio depende de la causa. El tratamiento puede centrarse en tratar Neomia Dear afeccin preexistente que causa el insomnio.  Lleve un registro del sueo ya que podra ser de utilidad para que usted y a su mdico puedan determinar qu podra estar causndole insomnio. Esta informacin no tiene Public house manager el consejo del mdico. Asegrese de hacerle al mdico cualquier pregunta que tenga. Document Revised: 12/15/2017 Document Reviewed: 08/24/2017 Elsevier Patient Education  2020 [employer].

## 2022-02-10 ENCOUNTER — Emergency Department (HOSPITAL_BASED_OUTPATIENT_CLINIC_OR_DEPARTMENT_OTHER)
Admission: EM | Admit: 2022-02-10 | Discharge: 2022-02-11 | Discharge status: Against Medical Advice | Payer: Medicaid Other | Attending: Emergency Medicine | Admitting: Emergency Medicine

## 2022-02-10 ENCOUNTER — Encounter (HOSPITAL_BASED_OUTPATIENT_CLINIC_OR_DEPARTMENT_OTHER): Payer: Self-pay | Admitting: Emergency Medicine

## 2022-02-10 ENCOUNTER — Other Ambulatory Visit: Payer: Self-pay

## 2022-02-10 ENCOUNTER — Emergency Department (HOSPITAL_BASED_OUTPATIENT_CLINIC_OR_DEPARTMENT_OTHER): Payer: Medicaid Other

## 2022-02-10 DIAGNOSIS — R079 Chest pain, unspecified: Secondary | ICD-10-CM | POA: Insufficient documentation

## 2022-02-10 DIAGNOSIS — R0602 Shortness of breath: Secondary | ICD-10-CM | POA: Diagnosis present

## 2022-02-10 LAB — TROPONIN I (HIGH SENSITIVITY)
Troponin I (High Sensitivity): 3 ng/L (ref <18)
Troponin I (High Sensitivity): 3 ng/L (ref <18)

## 2022-02-10 LAB — COMPREHENSIVE METABOLIC PANEL
ALT: 20 U/L (ref 0–44)
AST: 21 U/L (ref 15–41)
Albumin: 3.9 g/dL (ref 3.5–5.0)
Alkaline Phosphatase: 107 U/L (ref 38–126)
Anion gap: 7 (ref 5–15)
BUN: 8 mg/dL (ref 8–23)
CO2: 24 mmol/L (ref 22–32)
Calcium: 9.8 mg/dL (ref 8.9–10.3)
Chloride: 103 mmol/L (ref 98–111)
Creatinine, Ser: 0.52 mg/dL (ref 0.44–1.00)
GFR, Estimated: >60 mL/min (ref >60)
Glucose, Bld: 111 mg/dL — ABNORMAL HIGH (ref 70–99)
Potassium: 4.1 mmol/L (ref 3.5–5.1)
Sodium: 134 mmol/L — ABNORMAL LOW (ref 135–145)
Total Bilirubin: 0.6 mg/dL (ref 0.3–1.2)
Total Protein: 7.9 g/dL (ref 6.5–8.1)

## 2022-02-10 LAB — CBC WITH DIFFERENTIAL/PLATELET
Abs Immature Granulocytes: 0.04 10*3/uL (ref 0.00–0.07)
Basophils Absolute: 0 10*3/uL (ref 0.0–0.1)
Basophils Relative: 0 %
Eosinophils Absolute: 0 10*3/uL (ref 0.0–0.5)
Eosinophils Relative: 0 %
HCT: 40.8 % (ref 36.0–46.0)
Hemoglobin: 13.6 g/dL (ref 12.0–15.0)
Immature Granulocytes: 0 %
Lymphocytes Relative: 11 %
Lymphs Abs: 1.3 10*3/uL (ref 0.7–4.0)
MCH: 29.6 pg (ref 26.0–34.0)
MCHC: 33.3 g/dL (ref 30.0–36.0)
MCV: 88.9 fL (ref 80.0–100.0)
Monocytes Absolute: 1 10*3/uL (ref 0.1–1.0)
Monocytes Relative: 9 %
Neutro Abs: 8.7 10*3/uL — ABNORMAL HIGH (ref 1.7–7.7)
Neutrophils Relative %: 80 %
Platelets: 308 10*3/uL (ref 150–400)
RBC: 4.59 MIL/uL (ref 3.87–5.11)
RDW: 12.7 % (ref 11.5–15.5)
WBC: 11.1 10*3/uL — ABNORMAL HIGH (ref 4.0–10.5)
nRBC: 0 % (ref 0.0–0.2)

## 2022-02-10 LAB — D-DIMER, QUANTITATIVE: D-Dimer, Quant: 0.44 ug/mL-FEU (ref 0.00–0.50)

## 2022-02-10 MED ORDER — ASPIRIN 81 MG PO CHEW
324.0000 mg | CHEWABLE_TABLET | Freq: Once | ORAL | Status: AC
  Administered 2022-02-10: 324 mg via ORAL
  Filled 2022-02-10: qty 4

## 2022-02-10 NOTE — ED Triage Notes (Signed)
Pt arrives pov, slow gait, referred from UC r/t abnormal EKG, c/o non radiating left side CP that started x 5 days. Denies n/v, deneis cough or fever

## 2022-02-10 NOTE — ED Notes (Signed)
Patient transported to X-ray 

## 2022-02-10 NOTE — ED Notes (Signed)
ED Provider at bedside. 

## 2022-02-10 NOTE — ED Provider Notes (Signed)
[facility] EMERGENCY DEPARTMENT Provider Note   CSN: [id number] Arrival date & time: 02/10/22  1758     History  Chief Complaint  Patient presents with   Chest Pain    Mallory Cortez is a 71 y.o. female.  Level 5 caveat for language barrier, translator is used.  Patient presents with intermittent chest pain over the past week coming and going.  She describes pressure to the left side of her chest once or twice a day lasting for up to 30 minutes at a time.  The pain does not radiate to her arm, neck or back.  There are some associated shortness of breath.  No nausea, vomiting or diaphoresis.  No abdominal pain.  No back pain.  Does not appear to be pleuritic or exertional. Denies any cardiac history.  Does have a history of depression and anxiety.  She has never had a stress test. Urgent care was concerned about her EKG and sent her to the ED.  She does not have any chest pain currently. She does not smoke.  She moved from [place] about 15 years ago.  The history is provided by the patient and a relative. The history is limited by a language barrier. A language interpreter was used.  Chest Pain Associated symptoms: shortness of breath   Associated symptoms: no abdominal pain, no dizziness, no fever, no headache, no nausea, no vomiting and no weakness       Home Medications Prior to Admission medications   Medication Sig Start Date End Date Taking? Authorizing Provider  Glucosamine-Chondroit-Vit C-Mn (GLUCOSAMINE 1500 COMPLEX PO) Take by mouth.    [provider]  PARoxetine (PAXIL) 10 MG tablet Take 1 tablet (10 mg total) by mouth daily. 02/06/20   Mliss Sax, MD      Allergies    Penicillins    Review of Systems   Review of Systems  Constitutional:  Negative for activity change, appetite change and fever.  HENT:  Negative for congestion and rhinorrhea.   Respiratory:  Positive for chest tightness and shortness of breath.   Cardiovascular:   Positive for chest pain.  Gastrointestinal:  Negative for abdominal pain, nausea and vomiting.  Genitourinary:  Negative for dysuria and hematuria.  Musculoskeletal:  Negative for arthralgias and myalgias.  Skin:  Negative for rash.  Neurological:  Negative for dizziness, weakness and headaches.   all other systems are negative except as noted in the HPI and PMH.   Physical Exam Updated Vital Signs BP 136/69 (BP Location: Left Arm)   Pulse 73   Temp 98.8 F (37.1 C) (Oral)   Resp 16   Ht 5\' 1"  (1.549 m)   Wt 80.3 kg   SpO2 98%   BMI 33.44 kg/m  Physical Exam Vitals and nursing note reviewed.  Constitutional:      General: She is not in acute distress.    Appearance: She is well-developed.  HENT:     Head: Normocephalic and atraumatic.     Mouth/Throat:     Pharynx: No oropharyngeal exudate.  Eyes:     Conjunctiva/sclera: Conjunctivae normal.     Pupils: Pupils are equal, round, and reactive to light.  Neck:     Comments: No meningismus. Cardiovascular:     Rate and Rhythm: Normal rate and regular rhythm.     Heart sounds: Normal heart sounds. No murmur heard. Pulmonary:     Effort: Pulmonary effort is normal. No respiratory distress.     Breath sounds: Normal  breath sounds.  Abdominal:     Palpations: Abdomen is soft.     Tenderness: There is no abdominal tenderness. There is no guarding or rebound.  Musculoskeletal:        General: No tenderness. Normal range of motion.     Cervical back: Normal range of motion and neck supple.  Skin:    General: Skin is warm.  Neurological:     Mental Status: She is alert and oriented to person, place, and time.     Cranial Nerves: No cranial nerve deficit.     Motor: No abnormal muscle tone.     Coordination: Coordination normal.     Comments:  5/5 strength throughout. CN 2-12 intact.Equal grip strength.   Psychiatric:        Behavior: Behavior normal.    ED Results / Procedures / Treatments   Labs (all labs ordered are  listed, but only abnormal results are displayed) Labs Reviewed  CBC WITH DIFFERENTIAL/PLATELET - Abnormal; Notable for the following components:      Result Value   WBC 11.1 (*)    Neutro Abs 8.7 (*)    All other components within normal limits  COMPREHENSIVE METABOLIC PANEL - Abnormal; Notable for the following components:   Sodium 134 (*)    Glucose, Bld 111 (*)    All other components within normal limits  D-DIMER, QUANTITATIVE  TROPONIN I (HIGH SENSITIVITY)  TROPONIN I (HIGH SENSITIVITY)    EKG EKG Interpretation  Date/Time:  Friday Feb 10 2022 18:19:57 EDT Ventricular Rate:  71 PR Interval:  136 QRS Duration: 127 QT Interval:  365 QTC Calculation: 397 R Axis:   -54 Text Interpretation: Sinus rhythm 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 Abnormal EKG. Nonradiating left-sided chest pain starting 5 days ago. EXAM: CHEST - 2 VIEW COMPARISON:  None Available. FINDINGS: Cardiac silhouette and mediastinal contours are within normal limits. The lungs are clear. No pleural effusion or pneumothorax. Mild-to-moderate multilevel degenerative disc changes of the thoracic spine. IMPRESSION: No active cardiopulmonary disease. Electronically Signed   By: Neita Garnetonald  Viola M.D.   On: 02/10/2022 18:56    Procedures Procedures    Medications Ordered in ED Medications  aspirin chewable tablet 324 mg (324 mg Oral Given 02/10/22 1834)    ED Course/ Medical Decision Making/ A&P                           Medical Decision Making Amount and/or Complexity of Data Reviewed Independent Historian: caregiver Labs: ordered. Decision-making details documented in ED Course. Radiology: ordered and independent interpretation performed. Decision-making details documented in ED Course. ECG/medicine  tests: ordered and independent interpretation performed. Decision-making details documented in ED Course.  Risk OTC drugs. Decision regarding hospitalization.  1 week of intermittent chest pain, and going lasting for several minutes at a time.  Not exertional or pleuritic.  EKG here is sinus rhythm with IVCD and LVH, unchanged from previous.  No acute ST elevations.  No chest pain throughout ED stay.  Troponin is negative.  Heart score is 4 with intermediate risk for CAD.  Chest x-ray is negative.  Results reviewed and interpreted by me.  No evidence of pneumonia or pneumothorax or pleural effusion.  Discussed with patient and family at bedside.  She does not  want to be admitted to the hospital.  No further episodes of chest pain.  Patient and family now agreeable to overnight observation to rule out ACS.  She received aspirin. Heart score is 4.  Low suspicion for pulmonary embolism or aortic dissection.  D-dimer is negative.  Admission discussed with Dr. Toniann Fail.        Final Clinical Impression(s) / ED Diagnoses Final diagnoses:  Nonspecific chest pain    Rx / DC Orders ED Discharge Orders     None         Amarii Amy, Jeannett Senior, MD 02/10/22 2301

## 2022-02-10 NOTE — ED Notes (Signed)
Pt. Reports she had central chest pain at 9am today and none since.  Pt. Reports no chest pain after 9 am and no shob and no.  Pt. States she is feeling fine with no sweating or vomiting.  Pt. Has history of depression with poss. Thoughts that her meds might not be working.

## 2022-02-11 NOTE — ED Notes (Signed)
Pt. Has decided per the daughter who is translating for the Pt. That she will go home.  Pt. Has no ectopy noted on the monitor and her vital signs are WNL   Pt. Has been up to the restroom and walked a steady gait.  No shortness of breath noted.  Pt. Daughter asked multiple questions for the Pt. Very aware of AMA departure and that she can call 911 or return her if need be.  Encourage her to stay but Pt. Wanted to go home.

## 2022-03-01 ENCOUNTER — Encounter: Payer: Self-pay | Admitting: Internal Medicine

## 2022-03-01 ENCOUNTER — Ambulatory Visit (INDEPENDENT_AMBULATORY_CARE_PROVIDER_SITE_OTHER): Payer: Medicaid Other | Admitting: Internal Medicine

## 2022-03-01 VITALS — BP 132/68 | HR 66 | Ht 61.0 in | Wt 175.4 lb

## 2022-03-01 DIAGNOSIS — R079 Chest pain, unspecified: Secondary | ICD-10-CM | POA: Diagnosis not present

## 2022-03-01 DIAGNOSIS — R072 Precordial pain: Secondary | ICD-10-CM

## 2022-03-01 MED ORDER — METOPROLOL TARTRATE 50 MG PO TABS: 50.0000 mg | ORAL_TABLET | Freq: Once | ORAL | 0 refills | Status: AC

## 2022-03-01 NOTE — Progress Notes (Signed)
Cardiology Office Note:    Date:  03/01/2022   ID:  Mallory Cortez, DOB 09/19/1950, MRN [id number]  PCP:  No primary care provider on file.   [facility] Providers Cardiologist:  None     Referring MD: Ezequiel Essex, MD   No chief complaint on file. Chest pain  History of Present Illness:    Mallory Cortez is a 71 y.o. female with a hx of HTN, preDM A1c 5.9,  noted to not want to take medications, who presented to the ED 5/26 with intermittent chest pain that occurred once or twice daily for up to 30 minutes. No radiation, diaphoresis or nausea. EKG did not show ischemic changes. Troponin was negative. She did not want to be admitted. Today she is with her family, noticed chest pain a month ago. The pain is midsternal. Does not notice with activity. No significant dyspnea. It has not progressed. To relieve the pain , she moves around. No smoking hx .  Past Medical History:  Diagnosis Date   Hypertension    PT REFUSES TO TAKE MEDS.   PONV (postoperative nausea and vomiting)    Ulnar nerve compression    RIGHT ELBOW SECONDARY TO RETAINED HARDWARE    Past Surgical History:  Procedure Laterality Date   HARDWARE REMOVAL Right 01/28/2015   Procedure: RIGHT ARM DEEP HARDWARE REMOVAL;  Surgeon: Iran Planas, MD;  Location: [place];  Service: Orthopedics;  Laterality: Right;   ORIF RIGHT ULNA RX  2014   ULNAR NERVE TRANSPOSITION Right 01/28/2015   Procedure: RIGHT ELBOW ULNAR NERVE RELEASE  ;  Surgeon: Iran Planas, MD;  Location: [place];  Service: Orthopedics;  Laterality: Right;    Current Medications: No outpatient medications have been marked as taking for the 03/01/22 encounter (Appointment) with Janina Mayo, MD.     Allergies:   Penicillins   Social History   Socioeconomic History   Marital status: Married    Spouse name: Not on file   Number of children: Not on file   Years of education: Not on file   Highest education level:  Not on file  Occupational History   Not on file  Tobacco Use   Smoking status: Never   Smokeless tobacco: Never  Vaping Use   Vaping Use: Never used  Substance and Sexual Activity   Alcohol use: No   Drug use: No   Sexual activity: Not on file  Other Topics Concern   Not on file  Social History Narrative   Not on file   Social Determinants of Health   Financial Resource Strain: Not on file  Food Insecurity: Not on file  Transportation Needs: Not on file  Physical Activity: Not on file  Stress: Not on file  Social Connections: Not on file     Family History: No premature CAD  ROS:   Please see the history of present illness.     All other systems reviewed and are negative.  EKGs/Labs/Other Studies Reviewed:    The following studies were reviewed today:   EKG:  EKG is  ordered today.  The ekg ordered today demonstrates   NSR, LAD, non specific IVCD  Recent Labs: 02/10/2022: ALT 20; BUN 8; Creatinine, Ser 0.52; Hemoglobin 13.6; Platelets 308; Potassium 4.1; Sodium 134   Recent Lipid Panel No results found for: "CHOL", "TRIG", "HDL", "CHOLHDL", "VLDL", "LDLCALC", "LDLDIRECT"   Risk Assessment/Calculations:           Physical Exam:  VS:  Vitals:   03/01/22 1339  BP: 132/68  Pulse: 66  SpO2: 95%     Wt Readings from Last 3 Encounters:  02/10/22 177 lb (80.3 kg)  02/06/20 197 lb 6.4 oz (89.5 kg)  08/05/18 205 lb 6.4 oz (93.2 kg)     GEN:  Well nourished, well developed in no acute distress HEENT: Normal NECK: No JVD; No carotid bruits LYMPHATICS: No lymphadenopathy CARDIAC: RRR, no murmurs, rubs, gallops RESPIRATORY:  Clear to auscultation without rales, wheezing or rhonchi  ABDOMEN: Soft, non-tender, non-distended MUSCULOSKELETAL:  No edema; No deformity  SKIN: Warm and dry NEUROLOGIC:  Alert and oriented x 3 PSYCHIATRIC:  Normal affect   ASSESSMENT:    #Chest pain: She notes midsternal CP. Symptoms are atypical but with CVD risk will  obtain coronary CTA  #HTN:  good control   PLAN:    In order of problems listed above:  Coronary CTA morph Follow up as needed           Medication Adjustments/Labs and Tests Ordered: Current medicines are reviewed at length with the patient today.  Concerns regarding medicines are outlined above.  No orders of the defined types were placed in this encounter.  No orders of the defined types were placed in this encounter.   There are no Patient Instructions on file for this visit.   Signed, Janina Mayo, MD  03/01/2022 11:03 AM    [facility]

## 2022-03-01 NOTE — Patient Instructions (Signed)
Medication Instructions:  PLEASE TAKE METOPROLOL TARTRATE 50mg  TWO HOURS PRIOR TO CCTA SCAN  *If you need a refill on your cardiac medications before your next appointment, please call your pharmacy*  Testing/Procedures:   Your cardiac CT will be scheduled at one of the below locations:   [facility] [address] [place], [place] [id number]  If scheduled at [facility], please arrive at the [facility] Entrance (Entrance C2) of [facility] 30 minutes prior to test start time. You can use the FREE valet parking offered at entrance C (encouraged to control the heart rate for the test)  Proceed to the [facility] Radiology Department (first floor) to check-in and test prep.  All radiology patients and guests should use entrance C2 at [facility], accessed from [facility], even though the hospital's physical address listed is [address].     Please follow these instructions carefully (unless otherwise directed):  On the Night Before the Test: Be sure to Drink plenty of water. Do not consume any caffeinated/decaffeinated beverages or chocolate 12 hours prior to your test. Do not take any antihistamines 12 hours prior to your test.  On the Day of the Test: Drink plenty of water until 1 hour prior to the test. Do not eat any food 4 hours prior to the test. You may take your regular medications prior to the test.  Take metoprolol (Lopressor) two hours prior to test. HOLD Furosemide/Hydrochlorothiazide morning of the test. FEMALES- please wear underwire-free bra if available, avoid dresses & tight clothing  After the Test: Drink plenty of water. After receiving IV contrast, you may experience a mild flushed feeling. This is normal. On occasion, you may experience a mild rash up to 24 hours after the test. This is not dangerous. If this occurs, you can take Benadryl 25 mg and increase your fluid  intake. If you experience trouble breathing, this can be serious. If it is severe call 911 IMMEDIATELY. If it is mild, please call our office. If you take any of these medications: Glipizide/Metformin, Avandament, Glucavance, please do not take 48 hours after completing test unless otherwise instructed.  We will call to schedule your test 2-4 weeks out understanding that some insurance companies will need an authorization prior to the service being performed.   For non-scheduling related questions, please contact the cardiac imaging nurse navigator should you have any questions/concerns: [address], Cardiac Imaging Nurse Navigator Rockwell [place], Cardiac Imaging Nurse Navigator [facility] Direct Office Dial: [phone]   For scheduling needs, including cancellations and rescheduling, please call [phone], [phone].  Follow-Up: At [facility], you and your health needs are our priority.  As part of our continuing mission to provide you with exceptional heart care, we have created designated Provider Care Teams.  These Care Teams include your primary Cardiologist (physician) and Advanced Practice Providers (APPs -  Physician Assistants and Nurse Practitioners) who all work together to provide you with the care you need, when you need it.  We recommend signing up for the patient portal called "MyChart".  Sign up information is provided on this After Visit Summary.  MyChart is used to connect with patients for Virtual Visits (Telemedicine).  Patients are able to view lab/test results, encounter notes, upcoming appointments, etc.  Non-urgent messages can be sent to your provider as well.   To learn more about what you can do with MyChart, go to [facility].  Your next appointment:   AS NEEDED   The format for your next appointment:   In Person  Provider:   Maisie Fus, MD

## 2022-04-05 ENCOUNTER — Telehealth (HOSPITAL_COMMUNITY): Payer: Self-pay | Admitting: Emergency Medicine

## 2022-04-05 NOTE — Telephone Encounter (Signed)
Reaching out to patient to offer assistance regarding upcoming cardiac imaging study; pt verbalizes understanding of appt date/time, parking situation and where to check in, pre-test NPO status and medications ordered, and verified current allergies; name and call back number provided for further questions should they arise Rockwell Alexandria RN Navigator Cardiac Imaging [facility] [phone] office [phone] cell  Denies iv issues Arrival 1200  50mg  metoprolol tartrate

## 2022-04-07 ENCOUNTER — Ambulatory Visit (HOSPITAL_COMMUNITY)
Admission: RE | Admit: 2022-04-07 | Discharge: 2022-04-07 | Disposition: A | Discharge status: Home / Self Care | Payer: Medicaid Other | Source: Ambulatory Visit | Attending: Internal Medicine | Admitting: Internal Medicine

## 2022-04-07 DIAGNOSIS — R072 Precordial pain: Secondary | ICD-10-CM | POA: Diagnosis present

## 2022-04-07 MED ORDER — IOHEXOL 350 MG/ML SOLN
100.0000 mL | Freq: Once | INTRAVENOUS | Status: AC | PRN
  Administered 2022-04-07: 100 mL via INTRAVENOUS

## 2022-04-07 MED ORDER — NITROGLYCERIN 0.4 MG SL SUBL
SUBLINGUAL_TABLET | SUBLINGUAL | Status: AC
  Filled 2022-04-07: qty 2

## 2022-04-07 MED ORDER — NITROGLYCERIN 0.4 MG SL SUBL
0.8000 mg | SUBLINGUAL_TABLET | Freq: Once | SUBLINGUAL | Status: AC
  Administered 2022-04-07: 0.8 mg via SUBLINGUAL

## 2022-04-11 ENCOUNTER — Other Ambulatory Visit: Payer: Self-pay

## 2022-04-11 MED ORDER — ASPIRIN 81 MG PO TBEC: 81.0000 mg | DELAYED_RELEASE_TABLET | Freq: Every day | ORAL | 3 refills | Status: AC

## 2022-04-11 MED ORDER — ROSUVASTATIN CALCIUM 10 MG PO TABS: 10.0000 mg | ORAL_TABLET | Freq: Every day | ORAL | 3 refills | Status: AC

## 2022-09-23 IMAGING — CR DG CHEST 2V
2 series · 2 of 2 positions shown · non-contrast
Comparison: None Available.

CLINICAL DATA: Chest pain. Abnormal EKG. Nonradiating left-sided
chest pain starting 5 days ago.

EXAM:
CHEST - 2 VIEW

[w chest pa]
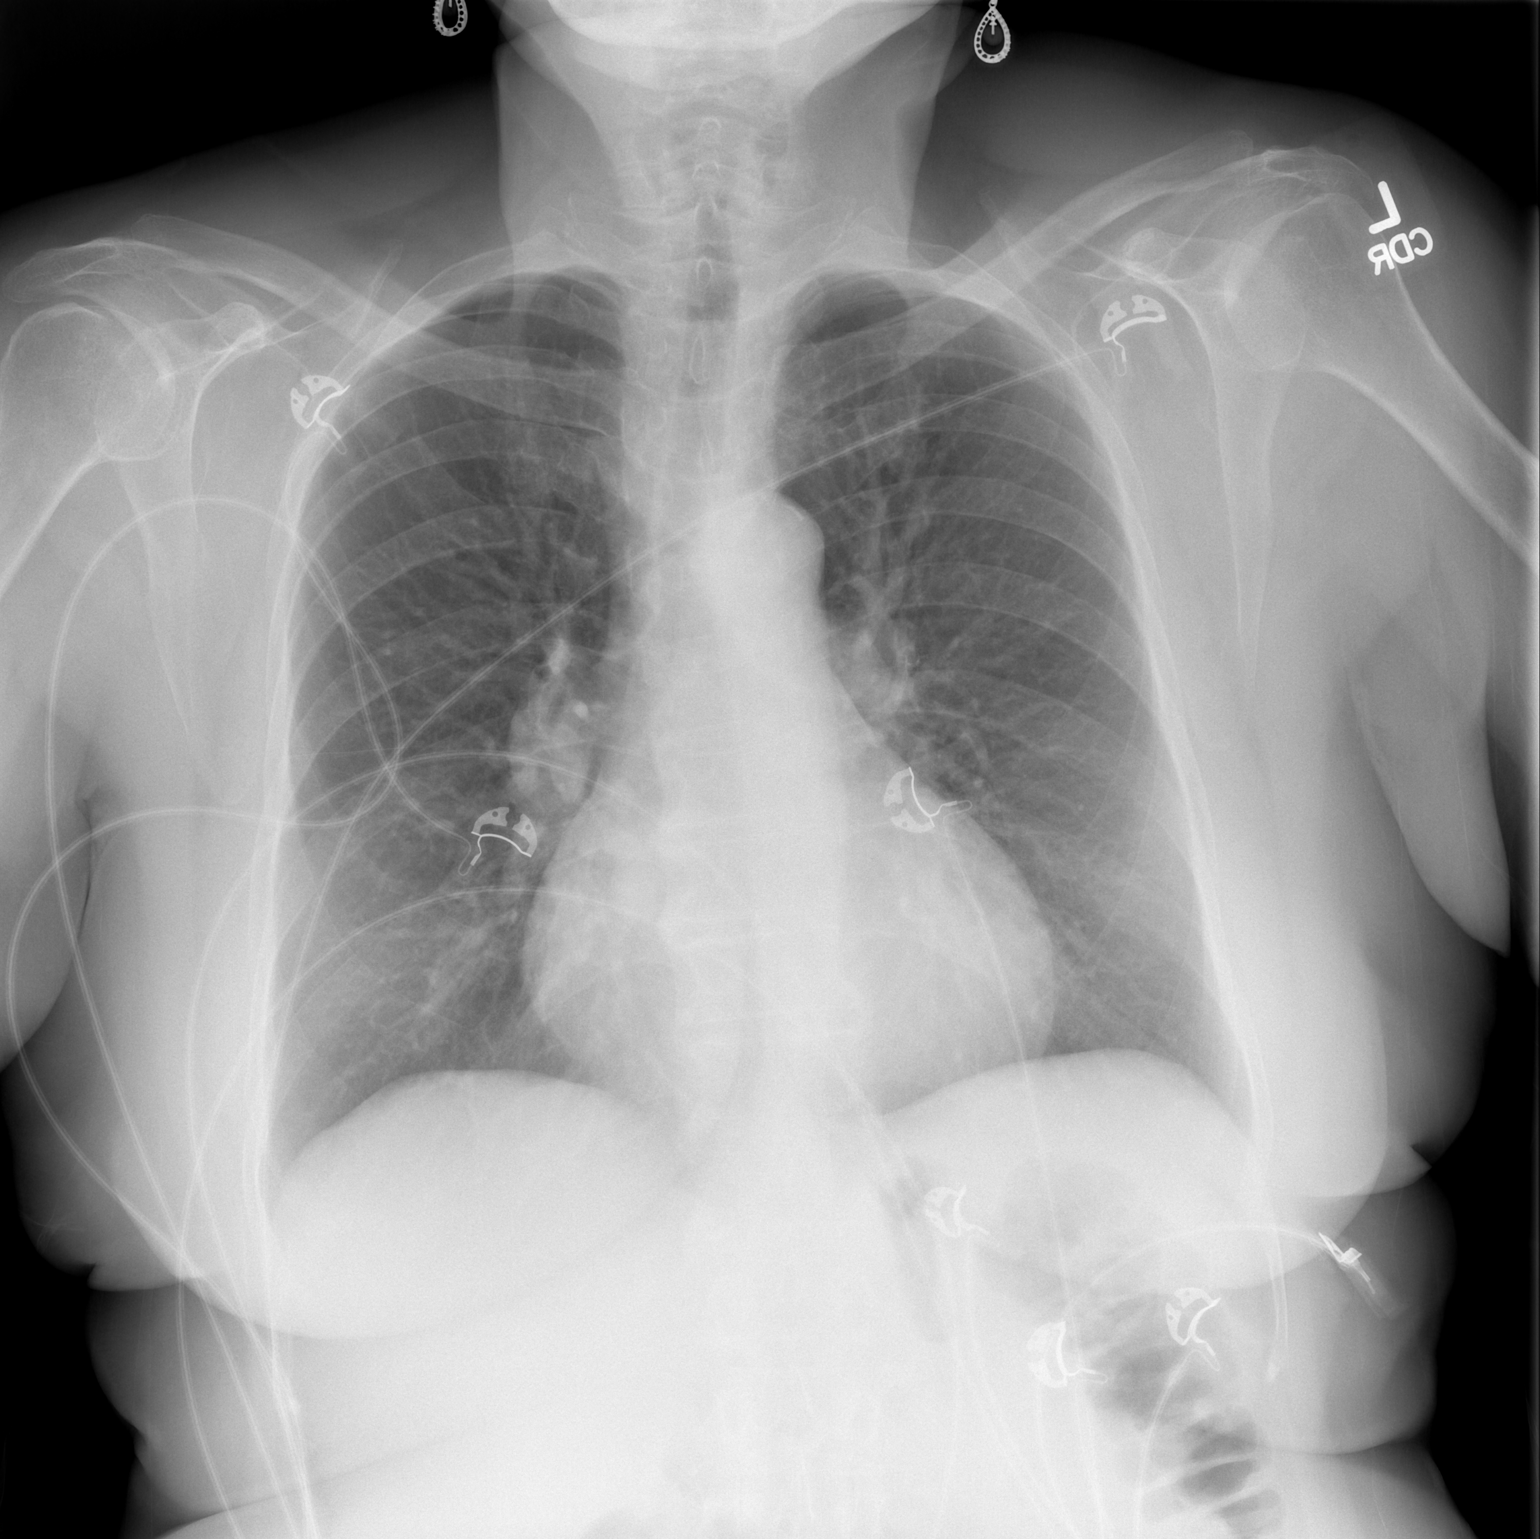

[w chest lat]
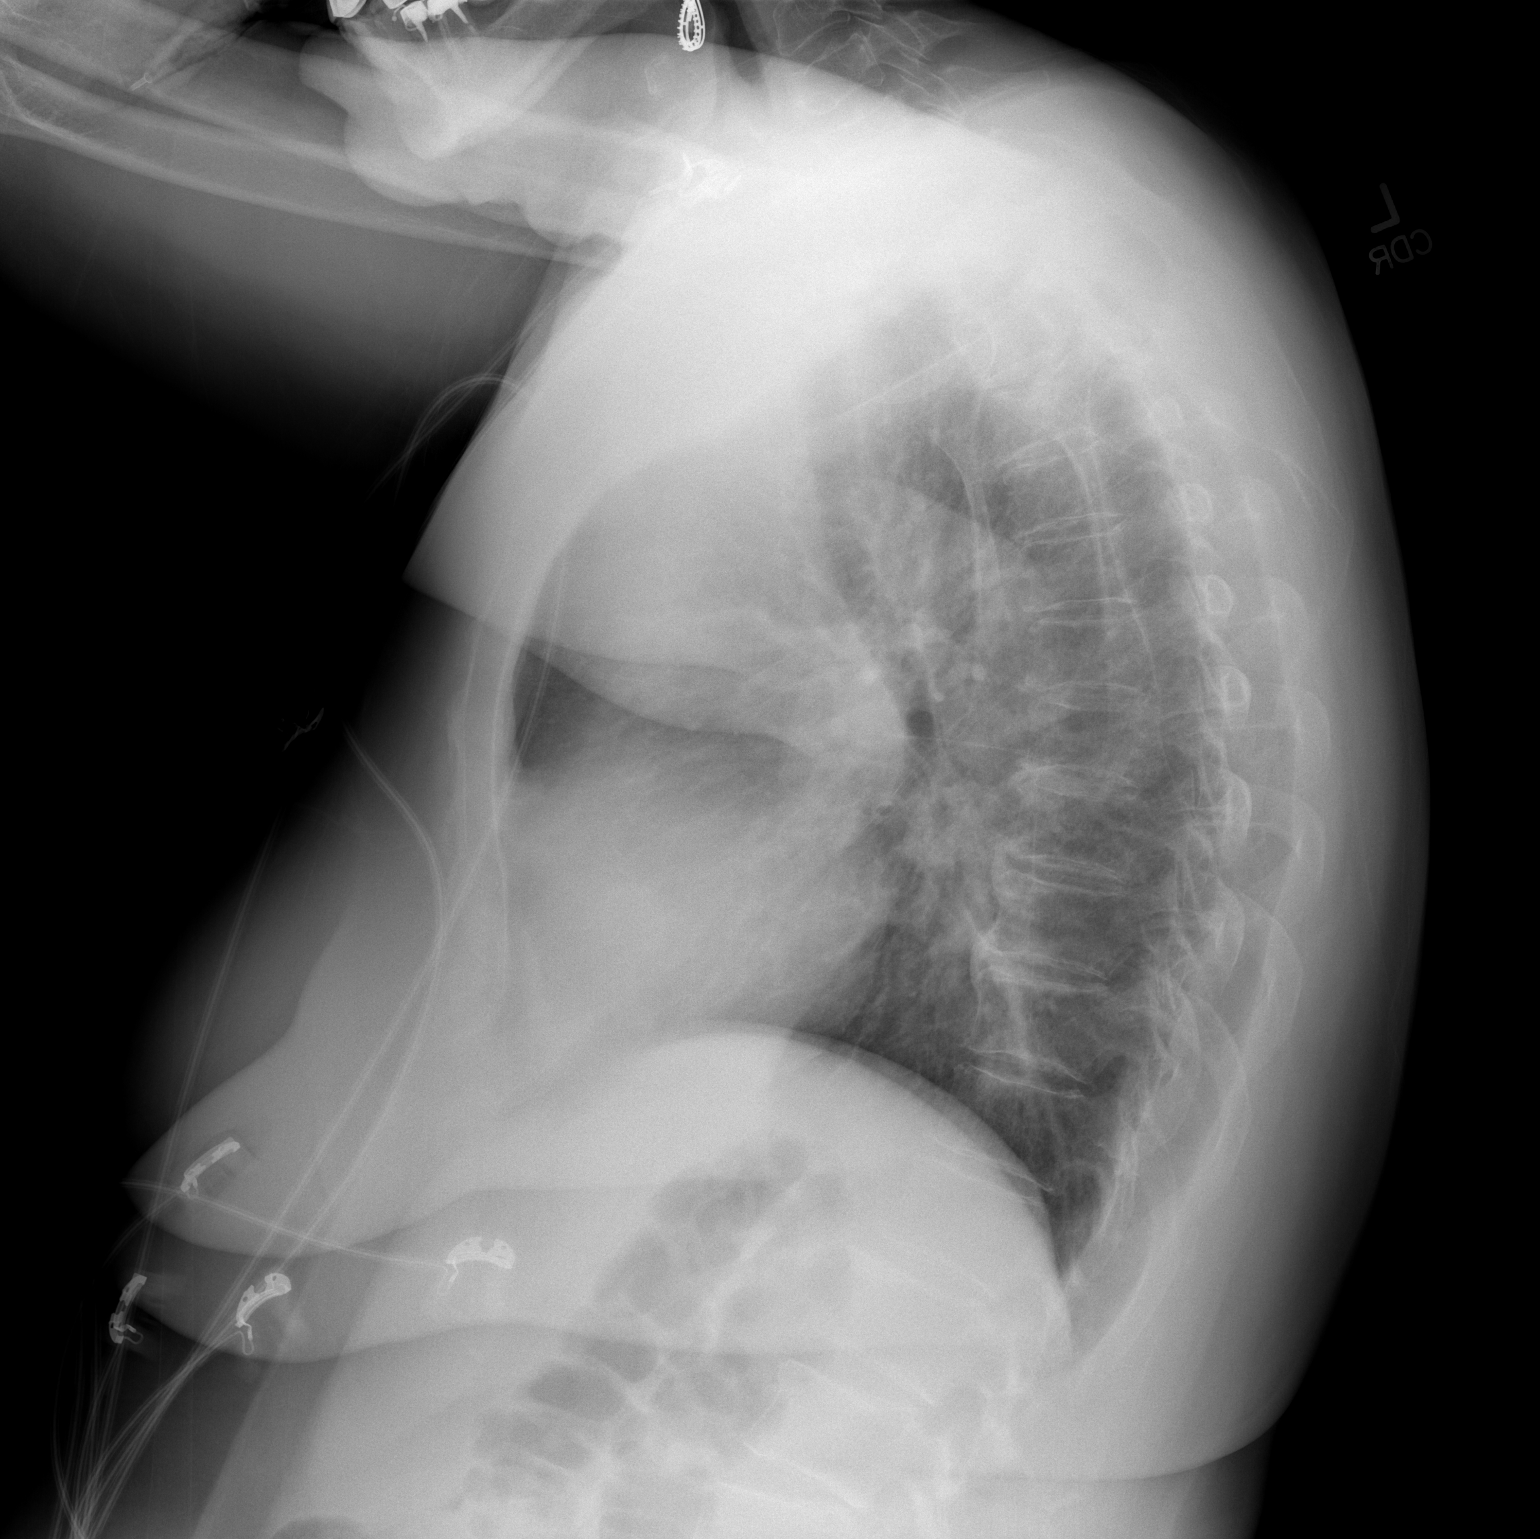

[2 of 2 positions shown; findings below may reference images not displayed]

FINDINGS: Cardiac silhouette and mediastinal contours are within normal
limits. The lungs are clear. No pleural effusion or pneumothorax.
Mild-to-moderate multilevel degenerative disc changes of the
thoracic spine.
IMPRESSION: No active cardiopulmonary disease.
